# Patient Record
Sex: Male | Born: 1995 | Hispanic: No | Smoking: Never smoker
Health system: Southern US, Community
[De-identification: ages and names within clinical notes are randomized; demographics above are authoritative.]

## PROBLEM LIST (undated history)

## (undated) DIAGNOSIS — E559 Vitamin D deficiency, unspecified: Secondary | ICD-10-CM

## (undated) HISTORY — DX: Vitamin D deficiency, unspecified: E55.9

## (undated) NOTE — Telephone Encounter (Signed)
Formatting of this note might be different from the original.  Clinical Contact Center Onboarding Outreach    Attempt: 1st attempt    Outcome: Unsuccessful - Left voicemail. Please connect them to the New Member number located in the [employer] phonebook. If the member calls back outside the business hours (7a-7p M-F excluding holidays) please give member this number [phone]) and ask them to call back during New Member Activation Desk hours.    Number of family members: 2  Electronically signed by Colbert Coyer at 04/07/2022 11:52 AM EST

## (undated) NOTE — Progress Notes (Signed)
Formatting of this note is different from the original.  Images from the original note were not included.  [facility]  [facility]       CHIEF COMPLAINT   SPOTS ON SKIN      SUBJECTIVE   Blake Monroe is a 41 yr old presenting with a rash for approximately 8 year(s).    Description: erythematous, scaly, pruritic, and spreading  Location: chest and back.  Changes in the rash since first noticing: Yes - started on arms only and chest and back  Irritant/trigger: none known  Throat swelling/difficulty breathing: No  Fevers: No  Symptoms of infection: No    Relevant medical history reviewed/updated.    OBJECTIVE   BP 112/53   Pulse 92   Temp 98.4 F (36.9 C) (Oral)   Resp 18   Wt 188 lb (85.3 kg)   SpO2 100%   GENERAL: Alert, nontoxic   CHEST: Unlabored breathing   SKIN: clustered, diffuse, erythematous    Location: chest and back   I have advised the patient that photograph/video/audio recording(s) will be taken of the patient for care delivery/treatment purposes and the patient consented to having the recording(s) taken.        ASSESSMENT/PLAN   Evaluation supportive of dermatitis.     ICD-10-CM   1. TINEA VERSICOLOR  B36.0   2. DERMATITIS  L30.9   3. DECLINES VACCINATION  Z28.20     Orders Placed This Encounter    Fluconazole (DIFLUCAN) 150 mg Oral Tab    Pyrithione Zinc (DHS ZINC) 2 % sclp Shampoo    Rapid Read TeleDerm       See diagnosis entry for additional diagnoses addressed in this visit.   Diagnoses, treatment plan, and precautions reviewed with patient.     I have confirmed the presence of the listed clinical diagnoses, which were considered in the current and ongoing care of the patient. At the time of this visit, the patient stated, and/or the medical record indicates, that there are no changes in these conditions, unless otherwise noted, and the patient was advised to follow up with specialist as treatment warrants.  Health Maintenance and care gaps reviewed and  addressed.    Telederm sent; response :    Tinea versicolor    RECOMMENDATIONS     TINEA VERSICOLOR    What is it?  - This is a common superficial infection of the skin caused by the yeast Malassezia furfur.    - Although it is part of the normal skin flora, a number of favorable conditions may trigger its conversion to the hyphal form, which results in clinical disease  - Favorable conditions include hot and humid weather, use of oils, excessive sweating, and immunosuppression  - It is more common in tropical climates (30-40% of adults)  - Recurrence of infection after successful treatment are common.  In the first year, the rate of recurrence can be as high as 60%, this rate increases to 80% in the second year    What does it look like?  - Well-demarcated, finely scaling thin plaques with variable pigmentation  - Most often occurs on the upper trunk and extremities  - Most patients don't have any symptoms, but sometimes it may be a little itchy  - Treated or inactive areas no longer have scaliness, but the color changes can persist for months after active infection has been eradicated    What are treatment options?  - Topical antifungal products are usually effective,  but sometimes oral medications are warranted.      MY RECOMMENDATIONS:    - Fluconazole (Diflucan) 150mg  --> Take two tablets once weekly x 4 weeks.  Potential side effects:  Nausea, headache, skin rash, vomiting, abdominal pain, diarrhea, increases in liver enzymes, rare cases of gallstones and hepatic failure.        FOR MAINTENANCE:  - Use Head and Shoulders (zinc pyrithione) shampoo as a body wash daily on weekends as a body wash chronically to prevent recurrence.  - The pigmentation changes may take several months to fade.  - You may repeat the more frequent application cycle if the condition flares in the future.    I have advised the patient that photograph/video/audio recording(s) will be taken of the patient for care delivery/treatment  purposes and the patient consented to having the recording(s) taken.    Patient instructions:  Patient Education           Electronically signed by Georgeanna Lea (D.O.), D.O. at 11/03/2021 12:21 PM EDT

---

## 2020-11-25 NOTE — Progress Notes (Signed)
Subjective:    CC: L knee pain  I, Anthony Maxwell, LAT, ATC, am serving as scribe for Dr. Clementeen Graham.  HPI: Pt is a 25 y/o male presenting w/ c/o L knee pain for past 7-8 years. [place] had [place] be in plaster cast for 1 month. Notes that when he runs or does squats, he will have anterior knee pain. Has tried topical analgesics and knee braces for pain relief.   Patient also complains of rash throughout body. Circular, non-raised pattern. No itching but notes that when working out in field that rash will become raised. Did use cream from dermatologist in home country which helped.   Pertinent review of Systems: No fevers or chills.  No weight loss.  Relevant historical information: Immigrated from [place].   Objective:    Vitals:   11/28/20 1016  BP: 114/80  Pulse: 86  SpO2: 98%   General: Well Developed, well nourished, and in no acute distress.   MSK: Left knee normal-appearing Normal motion without crepitation.  Nontender. Stable ligamentous exam. Intact strength. Negative McMurray's test.  Skin: Multiple circular macular mildly erythematous rashes across trunk and extremities and a somewhat Christmas tree pattern on trunk.  Nontender.  Lab and Radiology Results  Diagnostic Limited MSK Ultrasound of: Left knee Quad tendon intact and normal-appearing No significant joint effusion. Patellar tendon normal. Medial and lateral joint lines normal-appearing without visible tear of the medial or lateral meniscus. Posterior knee no Baker's cyst. Impression: Normal-appearing MSK ultrasound  X-ray images left knee obtained today personally and independently interpreted No acute fractures.  Noted significant degenerative changes. Await formal radiology review     Impression and Recommendations:    Assessment and Plan: 25 y.o. male with left knee pain.  History of what sounds like a ligamentous injury 7 or 8 years ago treated  with immobilization for about a month without good rehab.  Etiology today of his pain is somewhat unclear.  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  Possibly he has some persistent laxity that is not very well appreciated on exam testing today.  Plan for trial of conservative management with physical therapy trial and Voltaren gel and recheck in about 6 to 8 weeks.  If not improved would consider MRI as next step.  Rash: Likely pityriasis rosea based on appearance of rash.  Trial of topical triamcinolone cream.  Ideally this should be addressed by a primary care provider however he does not have a PCP.  Reassess and follow-up in 6 to 8 weeks.Marland Kitchen  PDMP not reviewed this encounter. Orders Placed This Encounter  Procedures   [place] LIMITED JOINT SPACE STRUCTURES LOW LEFT(NO LINKED CHARGES)    Order Specific Question:   Reason for Exam (SYMPTOM  OR DIAGNOSIS REQUIRED)    Answer:   L knee pain    Order Specific Question:   Preferred imaging location?    Answer:   [facility] Knee AP/LAT W/Sunrise Left    Standing Status:   Future    Number of Occurrences:   1    Standing Expiration Date:   11/28/2021    Order Specific Question:   Reason for Exam (SYMPTOM  OR DIAGNOSIS REQUIRED)    Answer:   eval knee pain    Order Specific Question:   Preferred imaging location?    Answer:   [facility]   Ambulatory referral to Physical Therapy    Referral Priority:  Routine    Referral Type:   Physical Medicine    Referral Reason:   Specialty Services Required    Requested Specialty:   Physical Therapy   Meds ordered this encounter  Medications   triamcinolone cream (KENALOG) 0.1 %    Sig: Apply 1 application topically 2 (two) times daily.    Dispense:  453.6 g    Refill:  5    Discussed warning signs or symptoms. Please see discharge instructions. Patient expresses understanding.   The above documentation has been reviewed and is accurate and complete Clementeen Graham,  M.D.

## 2020-11-28 ENCOUNTER — Encounter: Payer: Self-pay | Admitting: Family Medicine

## 2020-11-28 ENCOUNTER — Other Ambulatory Visit: Payer: Self-pay

## 2020-11-28 ENCOUNTER — Ambulatory Visit: Payer: Self-pay

## 2020-11-28 ENCOUNTER — Ambulatory Visit (INDEPENDENT_AMBULATORY_CARE_PROVIDER_SITE_OTHER): Payer: Medicaid Other

## 2020-11-28 ENCOUNTER — Ambulatory Visit (INDEPENDENT_AMBULATORY_CARE_PROVIDER_SITE_OTHER): Payer: Medicaid Other | Admitting: Family Medicine

## 2020-11-28 VITALS — BP 114/80 | HR 86 | Ht 70.87 in | Wt 189.0 lb

## 2020-11-28 DIAGNOSIS — M25562 Pain in left knee: Secondary | ICD-10-CM | POA: Diagnosis not present

## 2020-11-28 DIAGNOSIS — L42 Pityriasis rosea: Secondary | ICD-10-CM

## 2020-11-28 MED ORDER — TRIAMCINOLONE ACETONIDE 0.1 % EX CREA: 1.0000 "application " | TOPICAL_CREAM | Freq: Two times a day (BID) | CUTANEOUS | 5 refills | Status: AC

## 2020-11-28 NOTE — Patient Instructions (Addendum)
Thank you for coming in today.   Apply the cream 2x daily until the rash improves.   I think the rash is Pityriasis Rosea  You may need to do it again in the future.   Please get an Xray today before you leave   I've referred you to Physical Therapy.  Let us know if you don't hear from them in one week.   Please use Voltaren gel (Generic Diclofenac Gel) up to 4x daily for pain as needed.  This is available over-the-counter as both the name brand Voltaren gel and the generic diclofenac gel.   Recheck with me in 8 weeks.   If not improved I will plan for MRI of the knee.

## 2020-11-29 NOTE — Progress Notes (Signed)
Left knee x-ray looks normal to radiology

## 2020-12-14 ENCOUNTER — Ambulatory Visit: Payer: Medicaid Other | Attending: Family Medicine | Admitting: Physical Therapy

## 2020-12-14 DIAGNOSIS — G8929 Other chronic pain: Secondary | ICD-10-CM | POA: Insufficient documentation

## 2020-12-14 DIAGNOSIS — M25562 Pain in left knee: Secondary | ICD-10-CM | POA: Insufficient documentation

## 2020-12-14 DIAGNOSIS — M6281 Muscle weakness (generalized): Secondary | ICD-10-CM | POA: Insufficient documentation

## 2020-12-21 ENCOUNTER — Encounter: Payer: Self-pay | Admitting: Physical Therapy

## 2020-12-21 ENCOUNTER — Ambulatory Visit: Payer: Medicaid Other | Admitting: Physical Therapy

## 2020-12-21 ENCOUNTER — Other Ambulatory Visit: Payer: Self-pay

## 2020-12-21 DIAGNOSIS — G8929 Other chronic pain: Secondary | ICD-10-CM | POA: Diagnosis present

## 2020-12-21 DIAGNOSIS — M25562 Pain in left knee: Secondary | ICD-10-CM | POA: Diagnosis present

## 2020-12-21 DIAGNOSIS — M6281 Muscle weakness (generalized): Secondary | ICD-10-CM | POA: Diagnosis present

## 2020-12-21 NOTE — Therapy (Addendum)
[facility] [address] [place], [place], [address] Phone: [phone]   Fax:  [phone]  Physical Therapy Evaluation  Patient Details  Name: Anthony Maxwell MRN: [id number] Date of Birth: [birth year] Referring Provider (PT): Rodolph Bong, MD  Encounter Date: 12/21/2020   PT End of Session - 12/22/20 1345     Visit Number 1    Date for PT Re-Evaluation 02/16/21    Authorization Type [place] MCD - pending    PT Start Time 1745    PT Stop Time 1830    PT Time Calculation (min) 45 min    Activity Tolerance Patient tolerated treatment well    Behavior During Therapy [facility] for tasks assessed/performed             History reviewed. No pertinent past medical history.  History reviewed. No pertinent surgical history.  There were no vitals filed for this visit.    Subjective Assessment - 12/22/20 1343     Subjective Anthony Maxwell is a 25 y.o. male who presents to clinic with chief complaint of L knee pain.  MOI/History of condition: L knee pain for ~8 years.  He was sitting with his legs crossed when he tried to reach for something and had sudden onset of L knee pain.  He had very little knee motion immediately following.  His knee was immobilized for 1 month.  After this his leg felt somewhat better.  When playing football since he has sharp pain under this knee cap.  Pain location: under patella.  Red flags: none.  48 hour pain intensity:  highest 5/10, current 0/10, best 0/10.  Aggs: squats (5-10 squats delayed onset), bike (10-15 min).  Eases: icy hot type cream, rest.  Nature: sharp.  Severity: mod.  Irritability: mod.  Stage: chronic.  Stability: staying the same.  24 hour pattern: worse with activity.  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  Hobbies: soccer, bike, gym.  Functional limitations/goals: recreation, deep squatting, steps.  Home environment: lives with wife.  Assistive device: none.   Hand  dominance: R.  Falls: none.  Referring provider: Rodolph Bong, MD    Pertinent History Significant PMH: denies                 [facility] PT Assessment - 12/22/20 0001       Assessment   Medical Diagnosis M25.562 (ICD-10-CM) - Left knee pain, unspecified chronicity    Referring Provider (PT) Rodolph Bong, MD    Onset Date/Surgical Date 12/22/12    Hand Dominance Right    Next MD Visit 01/23/21    Prior Therapy none      Precautions   Precaution Comments non      Restrictions   Other Position/Activity Restrictions non      Balance Screen   Has the patient fallen in the past 6 months No    Has the patient had a decrease in activity level because of a fear of falling?  No    Is the patient reluctant to leave their home because of a fear of falling?  No      Functional Tests   Functional tests Single leg stance;Squat;Other      Squat   Comments R w/s during 10x squat      Single Leg Stance   Comments able to maintain >30'' ea, minimal hip drop, more unstable on L      Other:   Other/ Comments knee ext machine 1  RM: L: 65#, R: 80#      ROM / Strength   AROM / PROM / Strength --   knee ROM WNL     Flexibility   Soft Tissue Assessment /Muscle Length yes    Hamstrings L HS limited compared to R    Quadriceps WNL      Palpation   Palpation comment no TTP, pt reports sharp pain under kneecap with activity      Special Tests   Other special tests L knee ligamentous testing (-)                        Objective measurements completed on examination: See above findings.               PT Education - 12/22/20 1343     Education Details POC, diagnosis, prognosis, HEP.  Pt educated via explanation, demonstration, and handout (HEP).  Pt confirms understanding verbally.                 PT Long Term Goals - 12/22/20 1346       PT LONG TERM GOAL #1   Title Anthony Maxwell will be >75% HEP compliant throughout therapy to improve  carryover between sessions and facilitate independent management of condition.    Baseline no HEP    Status New    Target Date 02/16/21      PT LONG TERM GOAL #2   Title Anthony Maxwell will be able to perform a squat to 100 degrees knee flexion with no R w/s, not limited by pain to enable return to gym  EVAL: R w/s    Baseline R weight shift    Target Date 02/16/21      PT LONG TERM GOAL #3   Title Anthony Maxwell will be demonstrate quad strength in knee ext to within 10% of R leg  EVAL: L knee ext 65#, R 80#    Baseline L knee ext 65#, R 80#    Status New    Target Date 02/16/21      PT LONG TERM GOAL #4   Title Anthony Maxwell will report >/= 50% decrease in pain from evaluation  EVAL: 4/10 max pain    Baseline 4/10 max pain    Status New    Target Date 02/16/21      PT LONG TERM GOAL #5   Title Anthony Capuchin Leon will be able to bike for 45 min, not limited by pain  EVAL: limited to 10-15 min    Baseline EVAL: limited to 10-15 min    Status New    Target Date 02/16/21                    Plan - 12/22/20 1346     Clinical Impression Statement Anthony Maxwell is a 25 y.o. 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  Pt presents with pain and impairments/deficits in: L quad weakness compared to R, squat form, L HS length.  Activity limitations include: deep squatting, running, biking.  Participation limitations include: playing football, working out, recreation.  Pt will benefit from skilled therapy to address pain and the listed deficits in order to achieve functional goals, enable safety and independence in completion of daily tasks, and return to PLOF.    Stability/Clinical Decision Making Stable/Uncomplicated    Clinical Decision Making Low    Rehab Potential Good  PT Frequency 2x / week    PT Duration 8 weeks    PT Treatment/Interventions ADLs/Self Care Home Management;Aquatic Therapy;Gait training;Therapeutic  activities;Therapeutic exercise;Neuromuscular re-education;Manual techniques;Dry needling;Joint Manipulations;Iontophoresis 4mg /ml Dexamethasone    PT Next Visit Plan Update HEP for HS stretch, progressive quad strengthening, work on balance as able    PT Home Exercise Plan [email or web address]    Recommended Other Services none    Consulted and Agree with Plan of Care Patient             Patient will benefit from skilled therapeutic intervention in order to improve the following deficits and impairments:  Pain, Decreased strength, Impaired flexibility  Visit Diagnosis: Chronic pain of left knee - Plan: PT plan of care cert/re-cert  Muscle weakness - Plan: PT plan of care cert/re-cert     Problem List There are no problems to display for this patient.   PT, DPT 12/22/20 2:43 PM  [facility] [address] [place], [place], [place] Phone: [phone]   Fax:  [phone]  Name: Hershel Corkery MRN: Anthony Maxwell Date of Birth: 29-May-1995

## 2020-12-22 NOTE — Patient Instructions (Signed)
Access Code: [email or web address] URL: [email or web address] Date: 12/22/2020 Prepared by: Alphonzo Severance  Exercises Single Leg Knee Extension with Weight Machine - 3 x weekly - 3 sets - 5 reps - 7 hold Seated Hamstring Stretch - 2 x daily - 7 x weekly - 1 sets - 3 reps - 45 hold

## 2020-12-28 ENCOUNTER — Telehealth: Payer: Self-pay | Admitting: Physical Therapy

## 2020-12-28 ENCOUNTER — Ambulatory Visit: Payer: Medicaid Other | Admitting: Physical Therapy

## 2020-12-28 NOTE — Telephone Encounter (Signed)
Called and informed patient of missed visit and provided reminder of next appt and attendance policy.  

## 2021-01-05 ENCOUNTER — Other Ambulatory Visit: Payer: Self-pay

## 2021-01-05 ENCOUNTER — Ambulatory Visit: Payer: Medicaid Other

## 2021-01-05 DIAGNOSIS — M25562 Pain in left knee: Secondary | ICD-10-CM

## 2021-01-05 DIAGNOSIS — G8929 Other chronic pain: Secondary | ICD-10-CM

## 2021-01-05 DIAGNOSIS — M6281 Muscle weakness (generalized): Secondary | ICD-10-CM

## 2021-01-05 NOTE — Therapy (Addendum)
[facility] [address] [place], [place], [address] Phone: [phone]   Fax:  [phone]  Physical Therapy Treatment/Discharge  Patient Details  Name: Elton Heid MRN: [id number] Date of Birth: November 05, 1995 Referring Provider (PT): Rodolph Bong, MD   Encounter Date: 01/05/2021   PT End of Session - 01/06/21 0839     Visit Number 2    Date for PT Re-Evaluation 02/16/21    Authorization Type [place] MCD - CCME    Authorization Time Period 12/28/20 - 01/17/21    Authorization - Visit Number 1    Authorization - Number of Visits 3    PT Start Time 1800   arrived late   PT Stop Time 1828    PT Time Calculation (min) 28 min    Activity Tolerance Patient tolerated treatment well    Behavior During Therapy [facility] for tasks assessed/performed             No past medical history on file.  No past surgical history on file.  There were no vitals filed for this visit.   Subjective Assessment - 01/05/21 1800     Subjective Pt presents to PT with reports of continued medial L knee pain during exercise, up to 5/10. He has been compliant with his HEP with no adverse effect. Pt is ready to begin PT treatment at this time.    Currently in Pain? No/denies           [facility] Adult PT Treatment/Exercise:   Therapeutic Exercise:  Supine SLR 2x15 ea S/L clamshell 2x15 GTB Supine hamstring stretch w/ strap 2x30 sec Lateral walk x 3 laps in // RTB Mini squat in // 2x10 Single leg knee ext 3x10 L 20lbs     [facility] PT Assessment - 01/06/21 0001       AROM   Overall AROM Comments 50 degrees L ankle DF in half squat                                        PT Long Term Goals - 12/22/20 1346       PT LONG TERM GOAL #1   Title Mirian Capuchin Freund will be >75% HEP compliant throughout therapy to improve carryover between sessions and facilitate independent management of condition.    Baseline no HEP     Status New    Target Date 02/16/21      PT LONG TERM GOAL #2   Title Mirian Capuchin Graefe will be able to perform a squat to 100 degrees knee flexion with no R w/s, not limited by pain to enable return to gym  EVAL: R w/s    Baseline R weight shift    Target Date 02/16/21      PT LONG TERM GOAL #3   Title Thana Ates will be demonstrate quad strength in knee ext to within 10% of R leg  EVAL: L knee ext 65#, R 80#    Baseline L knee ext 65#, R 80#    Status New    Target Date 02/16/21      PT LONG TERM GOAL #4   Title Thana Ates will report >/= 50% decrease in pain from evaluation  EVAL: 4/10 max pain    Baseline 4/10 max pain    Status New    Target Date 02/16/21      PT LONG  TERM GOAL #5   Title Mirian Capuchin Avina will be able to bike for 45 min, not limited by pain  EVAL: limited to 10-15 min    Baseline EVAL: limited to 10-15 min    Status New    Target Date 02/16/21                   Plan - 01/05/21 1810     Clinical Impression Statement Pt was able to complete prescribed eexercises with no adverse effect. Today's session focused on continued strengthening of quad and proximal hip. Pt continues to benefit from PT and should continue to be seen per POC.    PT Treatment/Interventions ADLs/Self Care Home Management;Aquatic Therapy;Gait training;Therapeutic activities;Therapeutic exercise;Neuromuscular re-education;Manual techniques;Dry needling;Joint Manipulations;Iontophoresis 4mg /ml Dexamethasone    PT Next Visit Plan Update HEP for HS stretch, progressive quad strengthening, work on balance as able    PT Home Exercise Plan [email or web address]             Patient will benefit from skilled therapeutic intervention in order to improve the following deficits and impairments:  Pain, Decreased strength, Impaired flexibility  Visit Diagnosis: Chronic pain of left knee  Muscle weakness     Problem List There are no problems to display for this  patient.   , PT, DPT 01/06/21 8:41 AM  [facility] [address] [place], [place], [place] Phone: [phone]   Fax:  [phone]  Name: Eutimio Gharibian MRN: Thana Ates Date of Birth: 27-Jun-1995  PHYSICAL THERAPY DISCHARGE SUMMARY  Visits from Start of Care: 2  Current functional level related to goals / functional outcomes: Unable to assess   Remaining deficits: Unable to assess   Education / Equipment: Unable to assess   Patient agrees to discharge. Patient goals were  did not assess . Patient is being discharged due to not returning since the last visit.

## 2021-01-10 ENCOUNTER — Encounter: Payer: Self-pay | Admitting: Nurse Practitioner

## 2021-01-10 ENCOUNTER — Other Ambulatory Visit: Payer: Self-pay

## 2021-01-10 ENCOUNTER — Ambulatory Visit: Payer: Medicaid Other | Admitting: Nurse Practitioner

## 2021-01-10 VITALS — BP 105/63 | HR 84 | Temp 97.2°F | Ht 70.87 in | Wt 192.0 lb

## 2021-01-10 DIAGNOSIS — R21 Rash and other nonspecific skin eruption: Secondary | ICD-10-CM | POA: Diagnosis not present

## 2021-01-10 DIAGNOSIS — Z0000 Encounter for general adult medical examination without abnormal findings: Secondary | ICD-10-CM | POA: Diagnosis not present

## 2021-01-10 LAB — POCT URINALYSIS DIP (CLINITEK)
Bilirubin, UA: NEGATIVE
Glucose, UA: NEGATIVE mg/dL
Ketones, POC UA: NEGATIVE mg/dL
Leukocytes, UA: NEGATIVE
Nitrite, UA: NEGATIVE
POC PROTEIN,UA: NEGATIVE
Spec Grav, UA: >=1.03 — AB (ref 1.010–1.025)
Urobilinogen, UA: 0.2 E.U./dL
pH, UA: 5.5 (ref 5.0–8.0)

## 2021-01-10 LAB — POCT GLYCOSYLATED HEMOGLOBIN (HGB A1C): Hemoglobin A1C: 4.9 % (ref 4.0–5.6)

## 2021-01-10 MED ORDER — DIPHENHYDRAMINE HCL 25 MG PO TABS: 25.0000 mg | ORAL_TABLET | Freq: Four times a day (QID) | ORAL | 0 refills | Status: AC | PRN

## 2021-01-10 MED ORDER — FAMOTIDINE 40 MG PO TABS: 40.0000 mg | ORAL_TABLET | Freq: Every day | ORAL | 0 refills | Status: DC

## 2021-01-10 MED ORDER — PREDNISONE 20 MG PO TABS: 20.0000 mg | ORAL_TABLET | Freq: Two times a day (BID) | ORAL | 0 refills | Status: AC

## 2021-01-10 NOTE — Progress Notes (Addendum)
[place] [place], [place]  [address] Phone:  [phone]   Fax:  [phone] Subjective:   Patient ID: Anthony Maxwell, male    DOB: [birth year], 25 y.o.   MRN: [id number]  Chief Complaint  Patient presents with   Establish Care    Red spots all over body, rash. Was given triamcinolone cream, no relief.    HPI Anthony Maxwell 25 y.o. male presents with   Rash: Patient complains of rash involving the generalized. Rash started 1 year ago. Appearance of rash at onset: Color of lesion(s): red, Size of lesion(s): maculopapular. Rash has changed over time Initial distribution:  was initially in only the arms, currently generalized .  Discomfort associated with rash: is pruritic.  Associated symptoms: none. Denies: arthralgia, fever, and myalgia. Patient has had previous evaluation of rash. Patient has had previous treatment.  Response to treatment: no improvement. Patient has not had contacts with similar rash. Patient has not identified precipitant. Patient has not had new exposures (soaps, lotions, laundry detergents, foods, medications, plants, insects or animals.) Had condition prior,  3-4 yrs ago, given lotion, which resolved rash . States he has moved several times in the past year, refugee. Currently works as Investment banker, operational. Itching began 1 mth ago.  Denies any alleviating or aggravating factors.  History reviewed. No pertinent past medical history.  History reviewed. No pertinent surgical history.  History reviewed. No pertinent family history.  Social History   Socioeconomic History   Marital status: Single    Spouse name: Not on file   Number of children: Not on file   Years of education: Not on file   Highest education level: Not on file  Occupational History   Not on file  Tobacco Use   Smoking status: Never   Smokeless tobacco: Never  Substance and Sexual Activity   Alcohol use: Never   Drug use: Not Currently    Sexual activity: Yes  Other Topics Concern   Not on file  Social History Narrative   Not on file   Social Determinants of Health   Financial Resource Strain: Not on file  Food Insecurity: Not on file  Transportation Needs: Not on file  Physical Activity: Not on file  Stress: Not on file  Social Connections: Not on file  Intimate Partner Violence: Not on file    Outpatient Medications Prior to Visit  Medication Sig Dispense Refill   triamcinolone cream (KENALOG) 0.1 % Apply 1 application topically 2 (two) times daily. (Patient not taking: Reported on 01/10/2021) 453.6 g 5   No facility-administered medications prior to visit.    No Known Allergies  Review of Systems  Constitutional:  Negative for chills, fever and malaise/fatigue.  Eyes: Negative.   Respiratory:  Negative for cough and shortness of breath.   Cardiovascular:  Negative for chest pain, palpitations and leg swelling.  Gastrointestinal:  Negative for abdominal pain, blood in stool, constipation, diarrhea, nausea and vomiting.  Musculoskeletal: Negative.   Skin:  Positive for rash.  Psychiatric/Behavioral:  Negative for depression. The patient is not nervous/anxious.   All other systems reviewed and are negative.     Objective:    Physical Exam Vitals reviewed.  Constitutional:      General: He is not in acute distress.    Appearance: Normal appearance.  HENT:     Head: Normocephalic.  Eyes:     Extraocular Movements: Extraocular movements intact.     Conjunctiva/sclera: Conjunctivae normal.  Pupils: Pupils are equal, round, and reactive to light.  Cardiovascular:     Rate and Rhythm: Normal rate and regular rhythm.     Pulses: Normal pulses.     Heart sounds: Normal heart sounds.     Comments: No obvious peripheral edema Pulmonary:     Effort: Pulmonary effort is normal.     Breath sounds: Normal breath sounds.  Musculoskeletal:        General: Normal range of motion.  Lymphadenopathy:      Cervical: No cervical adenopathy.     Upper Body:     Right upper body: No supraclavicular adenopathy.     Left upper body: No supraclavicular adenopathy.  Skin:    General: Skin is warm and dry.     Capillary Refill: Capillary refill takes less than 2 seconds.     Comments: Generalized maculopapular rash with mild erythema. No excoriations. No drainage from sites.  Neurological:     General: No focal deficit present.     Mental Status: He is alert and oriented to person, place, and time.  Psychiatric:        Mood and Affect: Mood normal.        Behavior: Behavior normal.        Thought Content: Thought content normal.        Judgment: Judgment normal.    BP 105/63 (BP Location: Right Arm, Patient Position: Sitting)   Pulse 84   Temp (!) 97.2 F (36.2 C)   Ht 5' 10.87" (1.8 m)   Wt 192 lb 0.2 oz (87.1 kg)   SpO2 98%   BMI 26.88 kg/m  Wt Readings from Last 3 Encounters:  01/10/21 192 lb 0.2 oz (87.1 kg)  11/28/20 189 lb (85.7 kg)    Immunization History  Administered Date(s) Administered   Janssen (J&J) SARS-COV-2 Vaccination 06/09/2020   Unspecified SARS-COV-2 Vaccination 04/27/2020    Diabetic Foot Exam - Simple   No data filed     No results found for: TSH Lab Results  Component Value Date   WBC 7.9 01/10/2021   HGB 14.4 01/10/2021   HCT 42.6 01/10/2021   MCV 87 01/10/2021   PLT 276 01/10/2021   Lab Results  Component Value Date   NA 139 01/10/2021   K 4.2 01/10/2021   CO2 24 01/10/2021   GLUCOSE 97 01/10/2021   BUN 15 01/10/2021   CREATININE 0.86 01/10/2021   BILITOT 0.5 01/10/2021   ALKPHOS 57 01/10/2021   AST 25 01/10/2021   ALT 18 01/10/2021   PROT 7.3 01/10/2021   ALBUMIN 4.9 01/10/2021   CALCIUM 9.6 01/10/2021   EGFR 124 01/10/2021   Lab Results  Component Value Date   CHOL 145 01/10/2021   Lab Results  Component Value Date   HDL 34 (L) 01/10/2021   Lab Results  Component Value Date   LDLCALC 75 01/10/2021   Lab Results   Component Value Date   TRIG 218 (H) 01/10/2021   Lab Results  Component Value Date   CHOLHDL 4.3 01/10/2021   Lab Results  Component Value Date   HGBA1C 4.9 01/10/2021       Assessment & Plan:   Problem List Items Addressed This Visit   None Visit Diagnoses     Healthcare maintenance    -  Primary   Relevant Orders   CBC with Differential   Comprehensive metabolic panel   Lipid Panel   POCT URINALYSIS DIP (CLINITEK) (Completed)   HgB A1c (Completed)  Rash       Relevant Medications   predniSONE (DELTASONE) 20 MG tablet   diphenhydrAMINE (BENADRYL ALLERGY) 25 MG tablet   famotidine (PEPCID) 40 MG tablet   Patient to follow up in 6 mths for annual wellness exam or sooner as needed    I am having Parks Ranger start on predniSONE, diphenhydrAMINE, and famotidine. I am also having him maintain his triamcinolone cream.  Meds ordered this encounter  Medications   predniSONE (DELTASONE) 20 MG tablet    Sig: Take 1 tablet (20 mg total) by mouth in the morning and at bedtime for 5 days.    Dispense:  10 tablet    Refill:  0   diphenhydrAMINE (BENADRYL ALLERGY) 25 MG tablet    Sig: Take 1 tablet (25 mg total) by mouth every 6 (six) hours as needed for itching.    Dispense:  30 tablet    Refill:  0   famotidine (PEPCID) 40 MG tablet    Sig: Take 1 tablet (40 mg total) by mouth at bedtime for 5 days.    Dispense:  5 tablet    Refill:  0     Teena Dunk, NP

## 2021-01-10 NOTE — Patient Instructions (Signed)
You were seen today in the [facility] for your rash and establish care . Labs were collected, results will be available via MyChart or, if abnormal, you will be contacted by clinic staff. You were prescribed medications, please take as directed. Please follow up in 6 mths for annual exam, or sooner as needed.

## 2021-01-11 ENCOUNTER — Ambulatory Visit: Payer: Medicaid Other | Admitting: Physical Therapy

## 2021-01-11 LAB — COMPREHENSIVE METABOLIC PANEL
ALT: 18 IU/L (ref 0–44)
AST: 25 IU/L (ref 0–40)
Albumin/Globulin Ratio: 2 (ref 1.2–2.2)
Albumin: 4.9 g/dL (ref 4.1–5.2)
Alkaline Phosphatase: 57 IU/L (ref 44–121)
BUN/Creatinine Ratio: 17 (ref 9–20)
BUN: 15 mg/dL (ref 6–20)
Bilirubin Total: 0.5 mg/dL (ref 0.0–1.2)
CO2: 24 mmol/L (ref 20–29)
Calcium: 9.6 mg/dL (ref 8.7–10.2)
Chloride: 100 mmol/L (ref 96–106)
Creatinine, Ser: 0.86 mg/dL (ref 0.76–1.27)
Globulin, Total: 2.4 g/dL (ref 1.5–4.5)
Glucose: 97 mg/dL (ref 65–99)
Potassium: 4.2 mmol/L (ref 3.5–5.2)
Sodium: 139 mmol/L (ref 134–144)
Total Protein: 7.3 g/dL (ref 6.0–8.5)
eGFR: 124 mL/min/{1.73_m2} (ref >59)

## 2021-01-11 LAB — CBC WITH DIFFERENTIAL/PLATELET
Basophils Absolute: 0.1 10*3/uL (ref 0.0–0.2)
Basos: 1 %
EOS (ABSOLUTE): 0.3 10*3/uL (ref 0.0–0.4)
Eos: 3 %
Hematocrit: 42.6 % (ref 37.5–51.0)
Hemoglobin: 14.4 g/dL (ref 13.0–17.7)
Immature Grans (Abs): 0 10*3/uL (ref 0.0–0.1)
Immature Granulocytes: 1 %
Lymphocytes Absolute: 1.9 10*3/uL (ref 0.7–3.1)
Lymphs: 24 %
MCH: 29.4 pg (ref 26.6–33.0)
MCHC: 33.8 g/dL (ref 31.5–35.7)
MCV: 87 fL (ref 79–97)
Monocytes Absolute: 0.6 10*3/uL (ref 0.1–0.9)
Monocytes: 7 %
Neutrophils Absolute: 5 10*3/uL (ref 1.4–7.0)
Neutrophils: 64 %
Platelets: 276 10*3/uL (ref 150–450)
RBC: 4.89 x10E6/uL (ref 4.14–5.80)
RDW: 11.8 % (ref 11.6–15.4)
WBC: 7.9 10*3/uL (ref 3.4–10.8)

## 2021-01-11 LAB — LIPID PANEL
Chol/HDL Ratio: 4.3 ratio (ref 0.0–5.0)
Cholesterol, Total: 145 mg/dL (ref 100–199)
HDL: 34 mg/dL — ABNORMAL LOW (ref >39)
LDL Chol Calc (NIH): 75 mg/dL (ref 0–99)
Triglycerides: 218 mg/dL — ABNORMAL HIGH (ref 0–149)
VLDL Cholesterol Cal: 36 mg/dL (ref 5–40)

## 2021-01-18 ENCOUNTER — Telehealth: Payer: Self-pay

## 2021-01-18 ENCOUNTER — Other Ambulatory Visit: Payer: Self-pay | Admitting: Nurse Practitioner

## 2021-01-18 DIAGNOSIS — R21 Rash and other nonspecific skin eruption: Secondary | ICD-10-CM

## 2021-01-18 NOTE — Telephone Encounter (Signed)
Patient notified of referral

## 2021-01-18 NOTE — Telephone Encounter (Signed)
Patient left voicemail stating the medication that you sent in for his rash is not working. Is there something else you can recommend. Thanks

## 2021-01-20 NOTE — Progress Notes (Deleted)
   I, Christoper Fabian, LAT, ATC, am serving as scribe for Dr. Clementeen Graham.  Anthony Maxwell is a 25 y.o. male who presents to [facility] at [facility] today for f/u of chronic L knee pain.  He was last seen by Dr. Denyse Amass on 11/28/20 and was referred to PT of which he's completed 2 sessions.  He was also advised to use Voltaren gel on his knee.  Pt also c/o of a rash for which triamcinolone cream was prescribed.  Today, pt reports   Diagnostic testing:  L knee XR- 11/28/20  Pertinent review of systems: ***  Relevant historical information: ***   Exam:  There were no vitals taken for this visit. General: Well Developed, well nourished, and in no acute distress.   MSK: ***    Lab and Radiology Results No results found for this or any previous visit (from the past 72 hour(s)). No results found.     Assessment and Plan: 25 y.o. male with ***   PDMP not reviewed this encounter. No orders of the defined types were placed in this encounter.  No orders of the defined types were placed in this encounter.    Discussed warning signs or symptoms. Please see discharge instructions. Patient expresses understanding.   ***

## 2021-01-23 ENCOUNTER — Ambulatory Visit: Payer: Medicaid Other | Admitting: Family Medicine

## 2021-01-25 NOTE — Congregational Nurse Program (Signed)
  Dept: [phone]   Congregational Nurse Program Note  Date of Encounter: 01/25/2021  Past Medical History: No past medical history on file.  Encounter Details: patient wife came to see me stating that the patient is still having body rash and would like to see a dermatologist. Per patient chart, patient referral is in process and advised her to wait for communication from PCP office.  She also stated that the patient is still taking benadryl as needed but he is no longer taking Kenalog cream because it got finish. Spouse advised patient has upto 5 refills to go to pharmacy for refill.

## 2021-01-31 ENCOUNTER — Other Ambulatory Visit: Payer: Self-pay | Admitting: Nurse Practitioner

## 2021-01-31 ENCOUNTER — Telehealth: Payer: Self-pay

## 2021-01-31 DIAGNOSIS — R21 Rash and other nonspecific skin eruption: Secondary | ICD-10-CM

## 2021-01-31 NOTE — Telephone Encounter (Signed)
Rash medication didn't work Also waiting on derm referral

## 2021-03-09 ENCOUNTER — Emergency Department (HOSPITAL_COMMUNITY)
Admission: EM | Admit: 2021-03-09 | Discharge: 2021-03-09 | Disposition: A | Discharge status: Home / Self Care | Payer: Medicaid Other | Attending: Emergency Medicine | Admitting: Emergency Medicine

## 2021-03-09 ENCOUNTER — Encounter (HOSPITAL_COMMUNITY): Payer: Self-pay

## 2021-03-09 DIAGNOSIS — R197 Diarrhea, unspecified: Secondary | ICD-10-CM | POA: Diagnosis present

## 2021-03-09 DIAGNOSIS — R110 Nausea: Secondary | ICD-10-CM | POA: Insufficient documentation

## 2021-03-09 DIAGNOSIS — Z20822 Contact with and (suspected) exposure to covid-19: Secondary | ICD-10-CM | POA: Insufficient documentation

## 2021-03-09 LAB — CBC
HCT: 45.7 % (ref 39.0–52.0)
Hemoglobin: 15.7 g/dL (ref 13.0–17.0)
MCH: 30.1 pg (ref 26.0–34.0)
MCHC: 34.4 g/dL (ref 30.0–36.0)
MCV: 87.5 fL (ref 80.0–100.0)
Platelets: 222 10*3/uL (ref 150–400)
RBC: 5.22 MIL/uL (ref 4.22–5.81)
RDW: 11.9 % (ref 11.5–15.5)
WBC: 8.2 10*3/uL (ref 4.0–10.5)
nRBC: 0 % (ref 0.0–0.2)

## 2021-03-09 LAB — COMPREHENSIVE METABOLIC PANEL
ALT: 21 U/L (ref 0–44)
AST: 29 U/L (ref 15–41)
Albumin: 4.5 g/dL (ref 3.5–5.0)
Alkaline Phosphatase: 50 U/L (ref 38–126)
Anion gap: 7 (ref 5–15)
BUN: 10 mg/dL (ref 6–20)
CO2: 20 mmol/L — ABNORMAL LOW (ref 22–32)
Calcium: 8.9 mg/dL (ref 8.9–10.3)
Chloride: 106 mmol/L (ref 98–111)
Creatinine, Ser: 0.86 mg/dL (ref 0.61–1.24)
GFR, Estimated: >60 mL/min (ref >60)
Glucose, Bld: 101 mg/dL — ABNORMAL HIGH (ref 70–99)
Potassium: 3.4 mmol/L — ABNORMAL LOW (ref 3.5–5.1)
Sodium: 133 mmol/L — ABNORMAL LOW (ref 135–145)
Total Bilirubin: 1 mg/dL (ref 0.3–1.2)
Total Protein: 7.7 g/dL (ref 6.5–8.1)

## 2021-03-09 LAB — URINALYSIS, ROUTINE W REFLEX MICROSCOPIC
Bilirubin Urine: NEGATIVE
Glucose, UA: NEGATIVE mg/dL
Ketones, ur: 20 mg/dL — AB
Leukocytes,Ua: NEGATIVE
Nitrite: NEGATIVE
Protein, ur: 30 mg/dL — AB
Specific Gravity, Urine: 1.023 (ref 1.005–1.030)
pH: 5 (ref 5.0–8.0)

## 2021-03-09 LAB — RESP PANEL BY RT-PCR (FLU A&B, COVID) ARPGX2
Influenza A by PCR: NEGATIVE
Influenza B by PCR: NEGATIVE
SARS Coronavirus 2 by RT PCR: NEGATIVE

## 2021-03-09 LAB — LIPASE, BLOOD: Lipase: 31 U/L (ref 11–51)

## 2021-03-09 MED ORDER — DIPHENOXYLATE-ATROPINE 2.5-0.025 MG PO TABS: 1.0000 | ORAL_TABLET | Freq: Four times a day (QID) | ORAL | 0 refills | Status: AC | PRN

## 2021-03-09 NOTE — ED Notes (Signed)
An After Visit Summary was printed and given to the patient. Discharge instructions given and no further questions at this time.  

## 2021-03-09 NOTE — ED Provider Notes (Signed)
[facility]-EMERGENCY DEPT Provider Note   CSN: [id number] Arrival date & time: 03/09/21  0827     History Chief Complaint  Patient presents with   Diarrhea    Anthony Maxwell is a 25 y.o. male.  HPI He complains of diarrhea for 2 days, frequent, loose, brown color.  No blood associated.  He has nausea without vomiting.  He feels that he has lost too much fluid.  He denies vertigo, paresthesia, weakness, chest pain, shortness of breath, persistent abdominal pain.  He sometimes has cramping type of pain in his abdomen that comes and goes.  No known sick contacts.  There are no other known modifying factors.    History reviewed. No pertinent past medical history.  There are no problems to display for this patient.   History reviewed. No pertinent surgical history.     History reviewed. No pertinent family history.  Social History   Tobacco Use   Smoking status: Never   Smokeless tobacco: Never  Substance Use Topics   Alcohol use: Never   Drug use: Not Currently    Home Medications Prior to Admission medications   Medication Sig Start Date End Date Taking? Authorizing Provider  diphenoxylate-atropine (LOMOTIL) 2.5-0.025 MG tablet Take 1 tablet by mouth 4 (four) times daily as needed for diarrhea or loose stools. 03/09/21  Yes Mancel Bale, MD  diphenhydrAMINE (BENADRYL ALLERGY) 25 MG tablet Take 1 tablet (25 mg total) by mouth every 6 (six) hours as needed for itching. 01/10/21   Passmore, Enid Derry I, NP  famotidine (PEPCID) 40 MG tablet Take 1 tablet (40 mg total) by mouth at bedtime for 5 days. 01/10/21 01/15/21  Orion Crook I, NP  triamcinolone cream (KENALOG) 0.1 % Apply 1 application topically 2 (two) times daily. Patient not taking: Reported on 01/10/2021 11/28/20   Rodolph Bong, MD    Allergies    Patient has no known allergies.  Review of Systems   Review of Systems  All other systems reviewed and are negative.  Physical  Exam Updated Vital Signs BP 109/70   Pulse 69   Temp 97.9 F (36.6 C) (Oral)   Resp 18   Ht 5\' 10"  (1.778 m)   Wt 87.1 kg   SpO2 100%   BMI 27.55 kg/m   Physical Exam Vitals and nursing note reviewed.  Constitutional:      General: He is not in acute distress.    Appearance: He is well-developed. He is not ill-appearing, toxic-appearing or diaphoretic.  HENT:     Head: Normocephalic and atraumatic.     Right Ear: External ear normal.     Left Ear: External ear normal.  Eyes:     Conjunctiva/sclera: Conjunctivae normal.     Pupils: Pupils are equal, round, and reactive to light.  Neck:     Trachea: Phonation normal.  Cardiovascular:     Rate and Rhythm: Normal rate.  Pulmonary:     Effort: Pulmonary effort is normal. No respiratory distress.     Breath sounds: No stridor.  Abdominal:     General: There is no distension.     Palpations: Abdomen is soft.     Tenderness: There is no abdominal tenderness.  Musculoskeletal:        General: Normal range of motion.     Cervical back: Normal range of motion and neck supple.  Skin:    General: Skin is warm and dry.  Neurological:     Mental Status: He is  alert and oriented to person, place, and time.     Cranial Nerves: No cranial nerve deficit.     Sensory: No sensory deficit.     Motor: No abnormal muscle tone.     Coordination: Coordination normal.  Psychiatric:        Mood and Affect: Mood normal.        Behavior: Behavior normal.        Thought Content: Thought content normal.        Judgment: Judgment normal.    ED Results / Procedures / Treatments   Labs (all labs ordered are listed, but only abnormal results are displayed) Labs Reviewed  COMPREHENSIVE METABOLIC PANEL - Abnormal; Notable for the following components:      Result Value   Sodium 133 (*)    Potassium 3.4 (*)    CO2 20 (*)    Glucose, Bld 101 (*)    All other components within normal limits  URINALYSIS, ROUTINE W REFLEX MICROSCOPIC -  Abnormal; Notable for the following components:   Hgb urine dipstick MODERATE (*)    Ketones, ur 20 (*)    Protein, ur 30 (*)    Bacteria, UA RARE (*)    All other components within normal limits  RESP PANEL BY RT-PCR (FLU A&B, COVID) ARPGX2  LIPASE, BLOOD  CBC    EKG None  Radiology No results found.  Procedures Procedures   Medications Ordered in ED Medications - No data to display  ED Course  I have reviewed the triage vital signs and the nursing notes.  Pertinent labs & imaging results that were available during my care of the patient were reviewed by me and considered in my medical decision making (see chart for details).    MDM Rules/Calculators/A&P                            Patient Vitals for the past 24 hrs:  BP Temp Temp src Pulse Resp SpO2 Height Weight  03/09/21 1148 109/70 -- -- 69 18 100 % -- --  03/09/21 0839 (!) 126/95 97.9 F (36.6 C) Oral 89 16 97 % 5\' 10"  (1.778 m) 87.1 kg    4:44 PM Reevaluation with update and discussion. After initial assessment and treatment, an updated evaluation reveals no change in clinical status, findings are supravaginal questions were answered.   Medical Decision Making:  This patient is presenting for evaluation of diarrhea for 2 days, which does require a range of treatment options, and is a complaint that involves a moderate risk of morbidity and mortality. The differential diagnoses include gastroenteritis, viral process, bacterial infection, food poisoning. I decided to review old records, and in summary healthy male presenting with nonspecific symptoms short-term without red flags.  I did not require additional historical information from anyone.  Clinical Laboratory Tests Ordered, included CBC, Metabolic panel, Urinalysis, and viral panel . Review indicates normal except sodium slightly low, potassium slightly low, CO2 low, glucose slightly elevated.   Critical Interventions-clinical evaluation,  laboratory testing, observation and reassessment  After These Interventions, the Patient was reevaluated and was found stable during ED evaluation.  Signs are normal.  Patient with very short-term illness, characterized primarily by diarrhea.  No indication for further ED evaluation, additional laboratory testing or other interventions.  Symptomatic care is indicated at this point.  CRITICAL CARE-no Performed by: Mancel Bale  Nursing Notes Reviewed/ Care Coordinated Applicable Imaging Reviewed Interpretation of Laboratory Data  incorporated into ED treatment  The patient appears reasonably screened and/or stabilized for discharge and I doubt any other medical condition or other [facility] requiring further screening, evaluation, or treatment in the ED at this time prior to discharge.  Plan: Home Medications-use Imodium or prescribed Lomotil; Home Treatments-low fiber diet; return here if the recommended treatment, does not improve the symptoms; Recommended follow up-PCP,.     Final Clinical Impression(s) / ED Diagnoses Final diagnoses:  Diarrhea, unspecified type    Rx / DC Orders ED Discharge Orders          Ordered    diphenoxylate-atropine (LOMOTIL) 2.5-0.025 MG tablet  4 times daily PRN        03/09/21 1642             Mancel Bale, MD 03/09/21 1646

## 2021-03-09 NOTE — ED Triage Notes (Signed)
Pt states diarrhea for over 24 hours. Pt describes twisting in his stomach, along with nausea. Pt states he has no appetite. No relief with loperamide.

## 2021-03-09 NOTE — ED Provider Notes (Signed)
Emergency Medicine Provider Triage Evaluation Note  Anthony Maxwell , a 25 y.o. male  was evaluated in triage.  Pt complains of numerous episodes of nonbloody diarrhea x1 day.  Patient states diarrhea was preceded with body aches.  Denies cough, sore throat, nasal congestion.  No sick contacts or COVID exposures.  Admits to nausea however, denies vomiting.  Endorses a subjective fever yesterday.  He has been taking Imodium with no relief. Admits to some abdominal pain. No urinary symptoms.   Review of Systems  Positive: Fever, myalgias, abdominal pain, diarrhea, nausea Negative: CP  Physical Exam  BP (!) 126/95 (BP Location: Left Arm)   Pulse 89   Temp 97.9 F (36.6 C) (Oral)   Resp 16   Ht 5\' 10"  (1.778 m)   Wt 87.1 kg   SpO2 97%   BMI 27.55 kg/m  Gen:   Awake, no distress   Resp:  Normal effort  MSK:   Moves extremities without difficulty  Other:  Abdomen soft, nondistended, nontender to palpation in all quadrants without guarding or peritoneal signs. No rebound.   Medical Decision Making  Medically screening exam initiated at 8:45 AM.  Appropriate orders placed.  Milewski was informed that the remainder of the evaluation will be completed by another provider, this initial triage assessment does not replace that evaluation, and the importance of remaining in the ED until their evaluation is complete.  Labs COVID test   Bonney Aid 03/09/21 0848    03/11/21, MD 03/09/21 0900

## 2021-03-09 NOTE — Discharge Instructions (Signed)
It is not clear what is causing your diarrhea.  The test that we did today did not show any serious problems to indicate either dehydration, or a bacterial infection.  This could be a virus or possibly food poisoning.  To improve your condition, use a diet which is low fiber.  Also, drink plenty of fluids to improve your hydration status.  You can use Imodium, 4 mg, 4 times a day to help with the diarrhea.  We are also giving you a prescription for Lomotil which is stronger and can help to treat symptoms of diarrhea that are not controlled by Imodium.  Follow-up with the doctor of your choice if not better in 4 to 5 days.

## 2021-03-24 ENCOUNTER — Ambulatory Visit (INDEPENDENT_AMBULATORY_CARE_PROVIDER_SITE_OTHER): Payer: Medicaid Other | Admitting: Internal Medicine

## 2021-03-24 ENCOUNTER — Encounter: Payer: Self-pay | Admitting: Internal Medicine

## 2021-03-24 ENCOUNTER — Other Ambulatory Visit: Payer: Self-pay | Admitting: Internal Medicine

## 2021-03-24 ENCOUNTER — Other Ambulatory Visit: Payer: Self-pay

## 2021-03-24 VITALS — BP 117/58 | HR 91 | Temp 98.1°F | Resp 28 | Ht 70.0 in | Wt 184.7 lb

## 2021-03-24 DIAGNOSIS — Z23 Encounter for immunization: Secondary | ICD-10-CM | POA: Diagnosis present

## 2021-03-24 DIAGNOSIS — R21 Rash and other nonspecific skin eruption: Secondary | ICD-10-CM | POA: Diagnosis not present

## 2021-03-24 DIAGNOSIS — Z0000 Encounter for general adult medical examination without abnormal findings: Secondary | ICD-10-CM | POA: Diagnosis not present

## 2021-03-24 DIAGNOSIS — R197 Diarrhea, unspecified: Secondary | ICD-10-CM

## 2021-03-24 DIAGNOSIS — K219 Gastro-esophageal reflux disease without esophagitis: Secondary | ICD-10-CM

## 2021-03-24 DIAGNOSIS — B360 Pityriasis versicolor: Secondary | ICD-10-CM

## 2021-03-24 MED ORDER — FAMOTIDINE 20 MG PO TABS: 20.0000 mg | ORAL_TABLET | Freq: Two times a day (BID) | ORAL | 1 refills | Status: DC

## 2021-03-24 MED ORDER — KETOCONAZOLE 2 % EX CREA: 1.0000 "application " | TOPICAL_CREAM | Freq: Every day | CUTANEOUS | 0 refills | Status: AC

## 2021-03-24 NOTE — Assessment & Plan Note (Signed)
Patient states that he has sour taste in mouth for last few weeks and heartburn.  He states that symptoms are not daily.  He has not taken medication for acid reflux in the past, but has had H. Pylori infection about 7 years ago.  This was treated and patient states that stool test following treatment said he no longer had H.pylori.  Plan: - Famotidine 20 mg BID - May consider Stool antigen testing for H pylori if symptoms persist - Follow-up in four weeks

## 2021-03-24 NOTE — Patient Instructions (Addendum)
Anthony Maxwell [facility], it was a pleasure seeing you today!  Today we discussed: Diarrhea- You likely had a virus that caused your symptoms.  It may take your body a bit to return to normal.  If you start to have diarrhea more frequently, please call the clinic and we will do further testing.  Rash- You have a fungal infection.  I sent in a cream called ketoconazole to treat this.  Acid reflux- Please start taking famotidine 40 mg daily and follow-up in four weeks.  I have ordered the following labs today:  Lab Orders  No laboratory test(s) ordered today      Referrals ordered today:   Referral Orders  No referral(s) requested today     I have ordered the following medication/changed the following medications:   Stop the following medications: There are no discontinued medications.   Start the following medications: No orders of the defined types were placed in this encounter.    Follow-up:  4 weeks    Please make sure to arrive 15 minutes prior to your next appointment. If you arrive late, you may be asked to reschedule.   We look forward to seeing you next time. Please call our clinic at [phone] if you have any questions or concerns. The best time to call is Monday-Friday from 9am-4pm, but there is someone available 24/7. If after hours or the weekend, call the main hospital number and ask for the Internal Medicine Resident On-Call. If you need medication refills, please notify your pharmacy one week in advance and they will send [place] a request.  Thank you for letting us take part in your care. Wishing you the best!  Thank you, Dr. [facility]

## 2021-03-24 NOTE — Assessment & Plan Note (Signed)
Received flu vaccine today

## 2021-03-24 NOTE — Assessment & Plan Note (Signed)
Patient presents due to itchy rash. He states that this has been present for the last few months.  It is on his arms, torso, back, and legs. He was seen in August for rash at another clinic and given 5 day course of prednisone 40 mg and triamcinolone cream without improvement in rash.  He states that he has a similar rash several years ago and was given a cream with resolution of rash.  On physical exam, diffuse hypopigmented, small round patches present on arms, legs, back and torso.    Rash is most likely Tinea versicolor. Plan: - Ketoconazole 2% cream applied once daily for 14 days

## 2021-03-24 NOTE — Progress Notes (Signed)
    Subjective:  CC: to establish care and diarrhea, rash, acid reflux  HPI:  [name] is a 25 y.o. male with a past medical history stated below and presents today to establish care and diarrhea, rash, acid reflux. Please see problem based assessment and plan for additional details.  No past medical history on file.  Current Outpatient Medications on File Prior to Visit  Medication Sig Dispense Refill   diphenhydrAMINE (BENADRYL ALLERGY) 25 MG tablet Take 1 tablet (25 mg total) by mouth every 6 (six) hours as needed for itching. 30 tablet 0   diphenoxylate-atropine (LOMOTIL) 2.5-0.025 MG tablet Take 1 tablet by mouth 4 (four) times daily as needed for diarrhea or loose stools. 20 tablet 0   triamcinolone cream (KENALOG) 0.1 % Apply 1 application topically 2 (two) times daily. (Patient not taking: Reported on 01/10/2021) 453.6 g 5   No current facility-administered medications on file prior to visit.    No family history on file.  Social History   Socioeconomic History   Marital status: Married    Spouse name: Not on file   Number of children: 1   Years of education: Not on file   Highest education level: Not on file  Occupational History   Not on file  Tobacco Use   Smoking status: Never   Smokeless tobacco: Never  Substance and Sexual Activity   Alcohol use: Never   Drug use: Not Currently   Sexual activity: Yes  Other Topics Concern   Not on file  Social History Narrative   Not on file   Social Determinants of Health   Financial Resource Strain: Not on file  Food Insecurity: Not on file  Transportation Needs: Not on file  Physical Activity: Not on file  Stress: Not on file  Social Connections: Not on file  Intimate Partner Violence: Not on file    Review of Systems: ROS negative except for what is noted on the assessment and plan.  Objective:   Vitals:   03/24/21 0931  BP: (!) 117/58  Pulse: 91  Resp: (!) 28  Temp: 98.1 F (36.7 C)   TempSrc: Oral  SpO2: 97%  Weight: 184 lb 11.2 oz (83.8 kg)  Height: 5\' 10"  (1.778 m)    Physical Exam: Gen: A&O x3 and in no apparent distress, well appearing and nourished. HEENT:    Head - normocephalic, atraumatic.    Eye - visual acuity grossly intact, conjunctiva clear, sclera non-icteric, EOM intact.    Mouth - No obvious caries or periodontal disease. Neck: no masses or nodules, AROM intact. CV: RRR, no murmurs, S1/S2 presents  Resp: Clear to ascultation bilaterally  Abd: BS (+) x4, soft, non-tender abdomen, without hepatosplenomegaly or masses MSK: Grossly normal AROM and strength x4 extremities. Skin: diffuse, hypopigmented, circular, circumscribed patches present on forearms, back, torso, and legs. Neuro: No focal deficits, grossly normal sensation and coordination.  Psych: Oriented x3 and responding appropriately. Intact memory, normal mood, judgement, affect, and insight.    Assessment & Plan:  See Encounters Tab for problem based charting.  Patient seen with Dr. Gerry Blanchfield, D.O. [facility]  PGY-1 Pager: [phone]  Phone: [phone] Date 03/24/2021  Time 1:55 PM

## 2021-03-24 NOTE — Assessment & Plan Note (Addendum)
Patient presents with diarrhea for the last 3 weeks.  He presented to the ED on October 27 due to diarrhea.  At that time, he was going 5-6 times at night and a couple times during the day.  He described diarrhea as watery. On lab evaluation in the ED, CBC, BMP, UA, and viral panel were normal outside of sodium at 133, K+ at 3.4, and CO2 at 20.  He was sent home with Lomotil.  He states that diarrhea has gradually improved over the last 2 weeks.  He now has diarrhea about 2 times weekly, but continues to show bloated.   Assessment: Presentation of acute diarrhea that is gradually improving is suggestive of infective process, likely gastroenteritis from a virus.  We will follow-up with patient in about 4 weeks, if diarrhea is persistent or has worsened will consider additional differentials including IBD or malabsorption causes of diarrhea.  Plan: Follow-up in 4 weeks

## 2021-03-27 NOTE — Progress Notes (Signed)
Internal Medicine Clinic Attending  I saw and evaluated the patient.  I personally confirmed the key portions of the history and exam documented by Dr. Masters and I reviewed pertinent patient test results.  The assessment, diagnosis, and plan were formulated together and I agree with the documentation in the resident's note.  

## 2021-07-11 ENCOUNTER — Ambulatory Visit: Payer: Medicaid Other | Admitting: Nurse Practitioner

## 2022-07-24 IMAGING — DX DG KNEE AP/LAT W/ SUNRISE*L*
3 series · 3 of 3 positions shown · non-contrast
Comparison: None.

CLINICAL DATA: Chronic anterior left knee pain increasing with
running and activity.

EXAM:
LEFT KNEE 3 VIEWS

[knee ap]
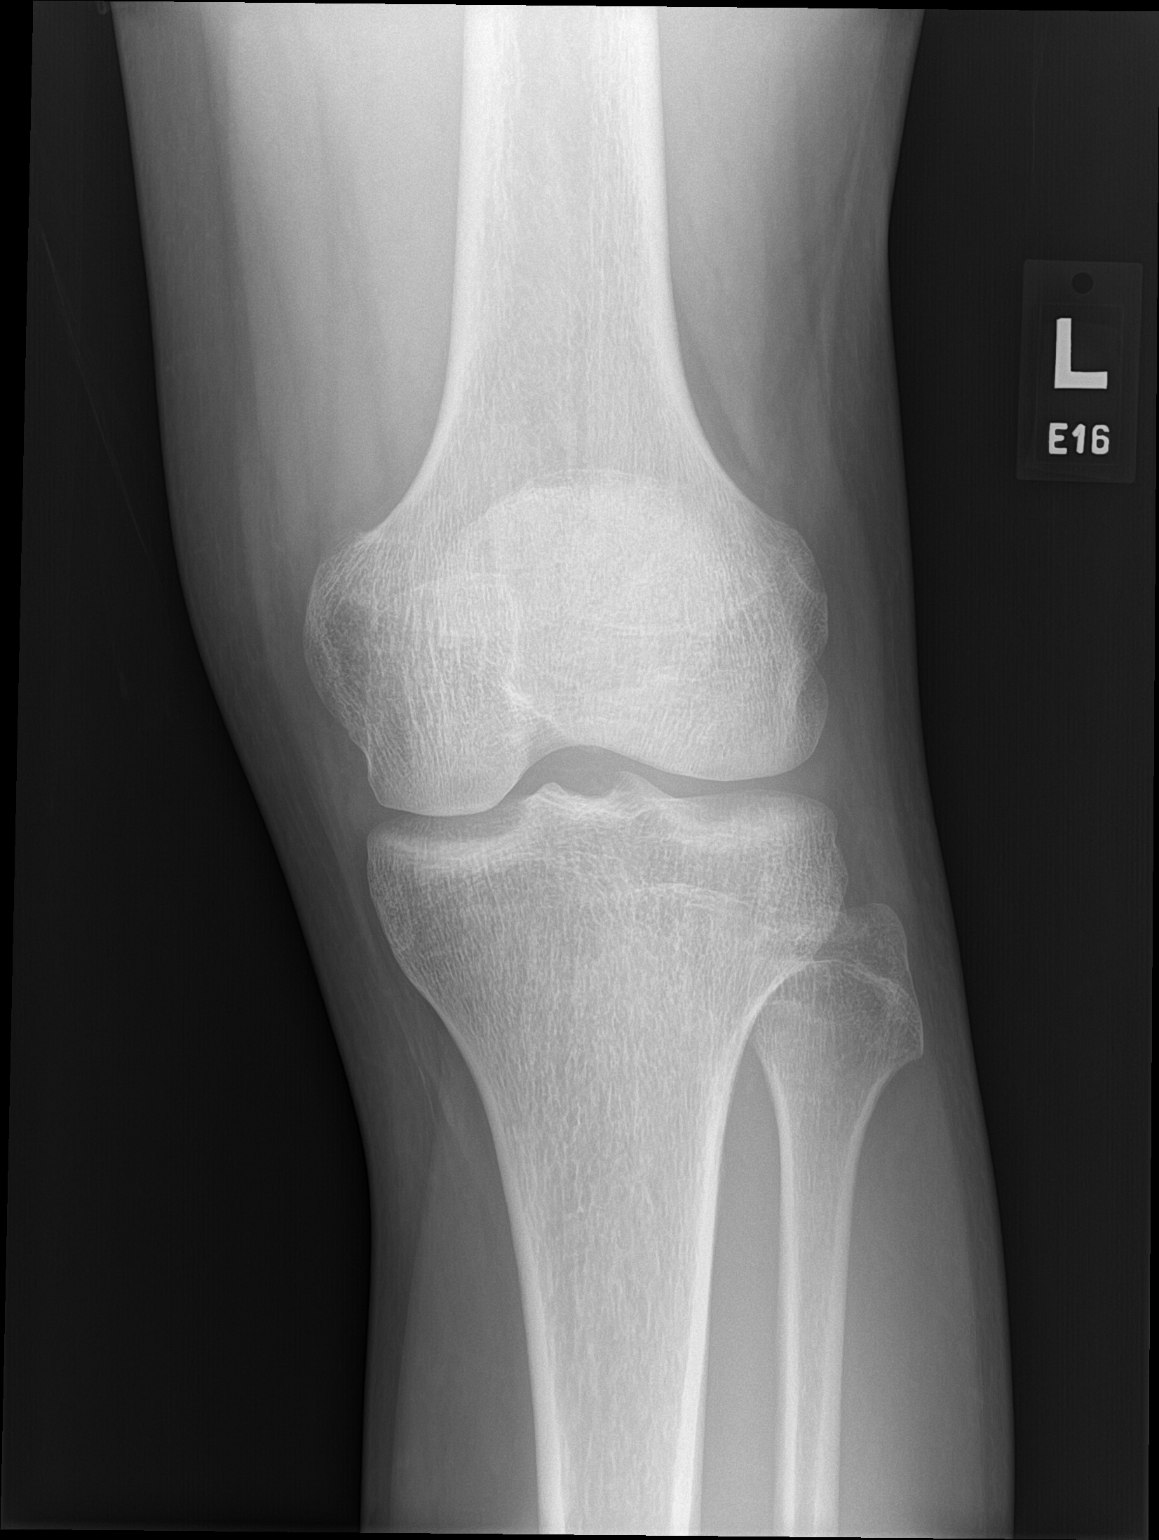

[knee lat]
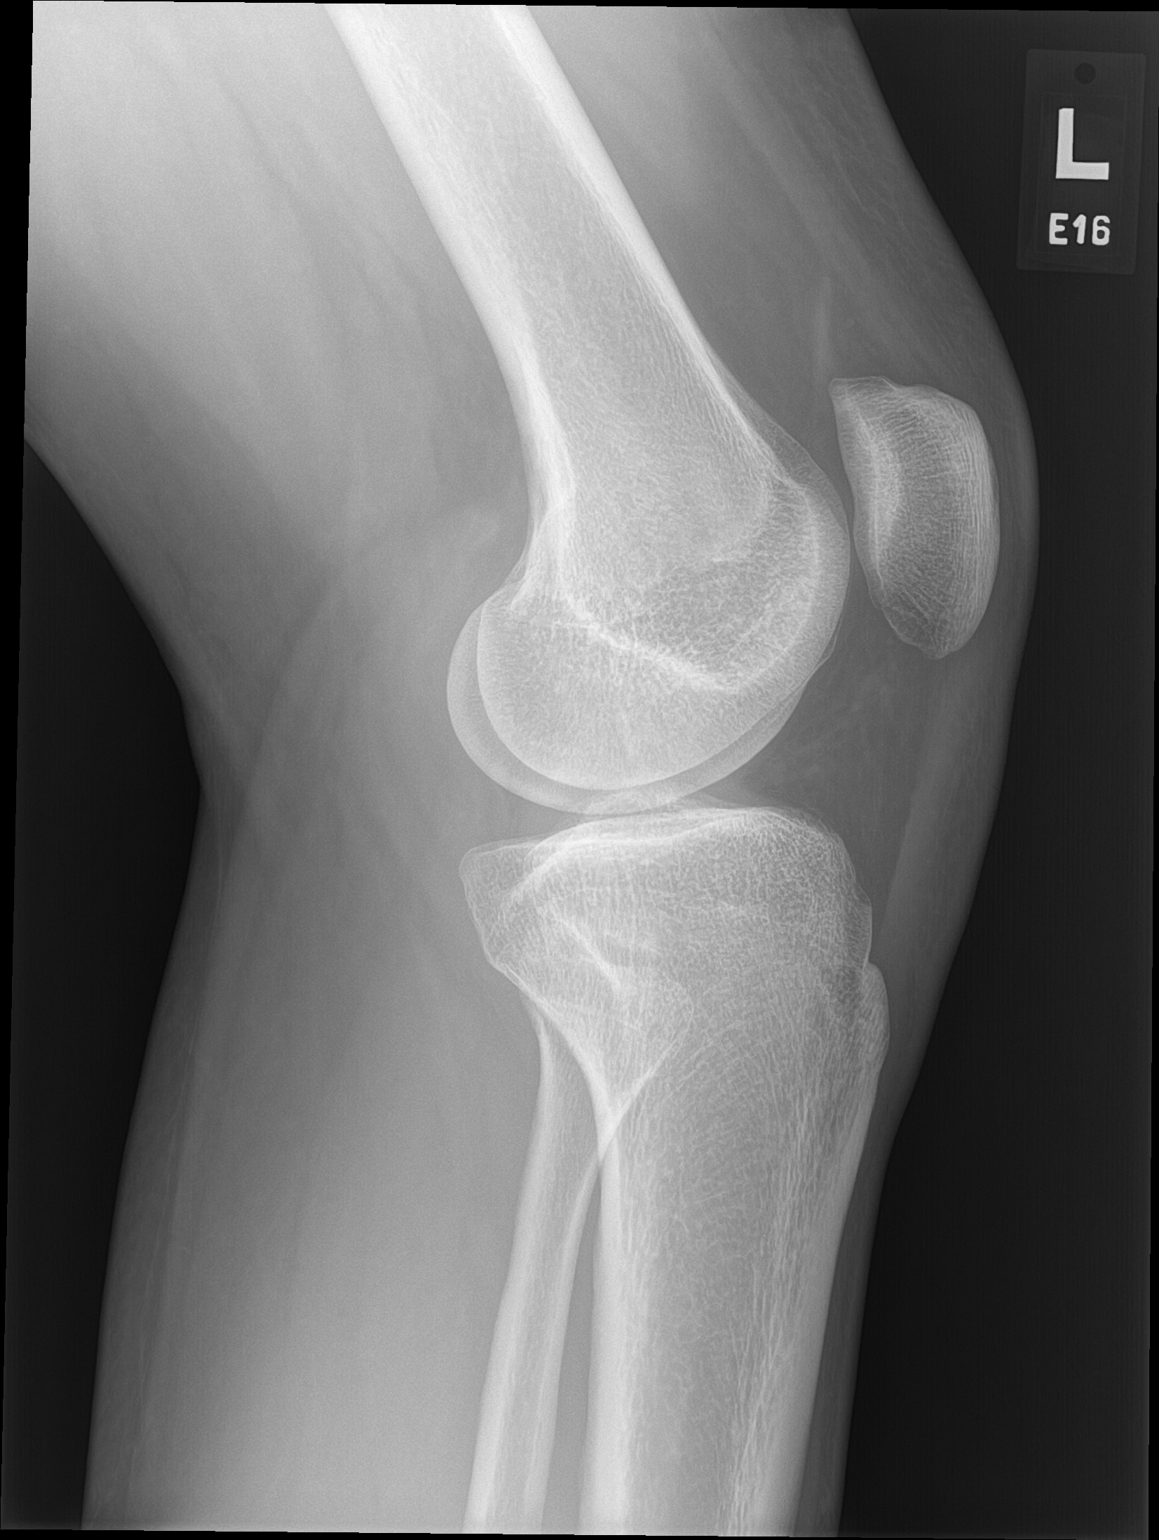

[patella]
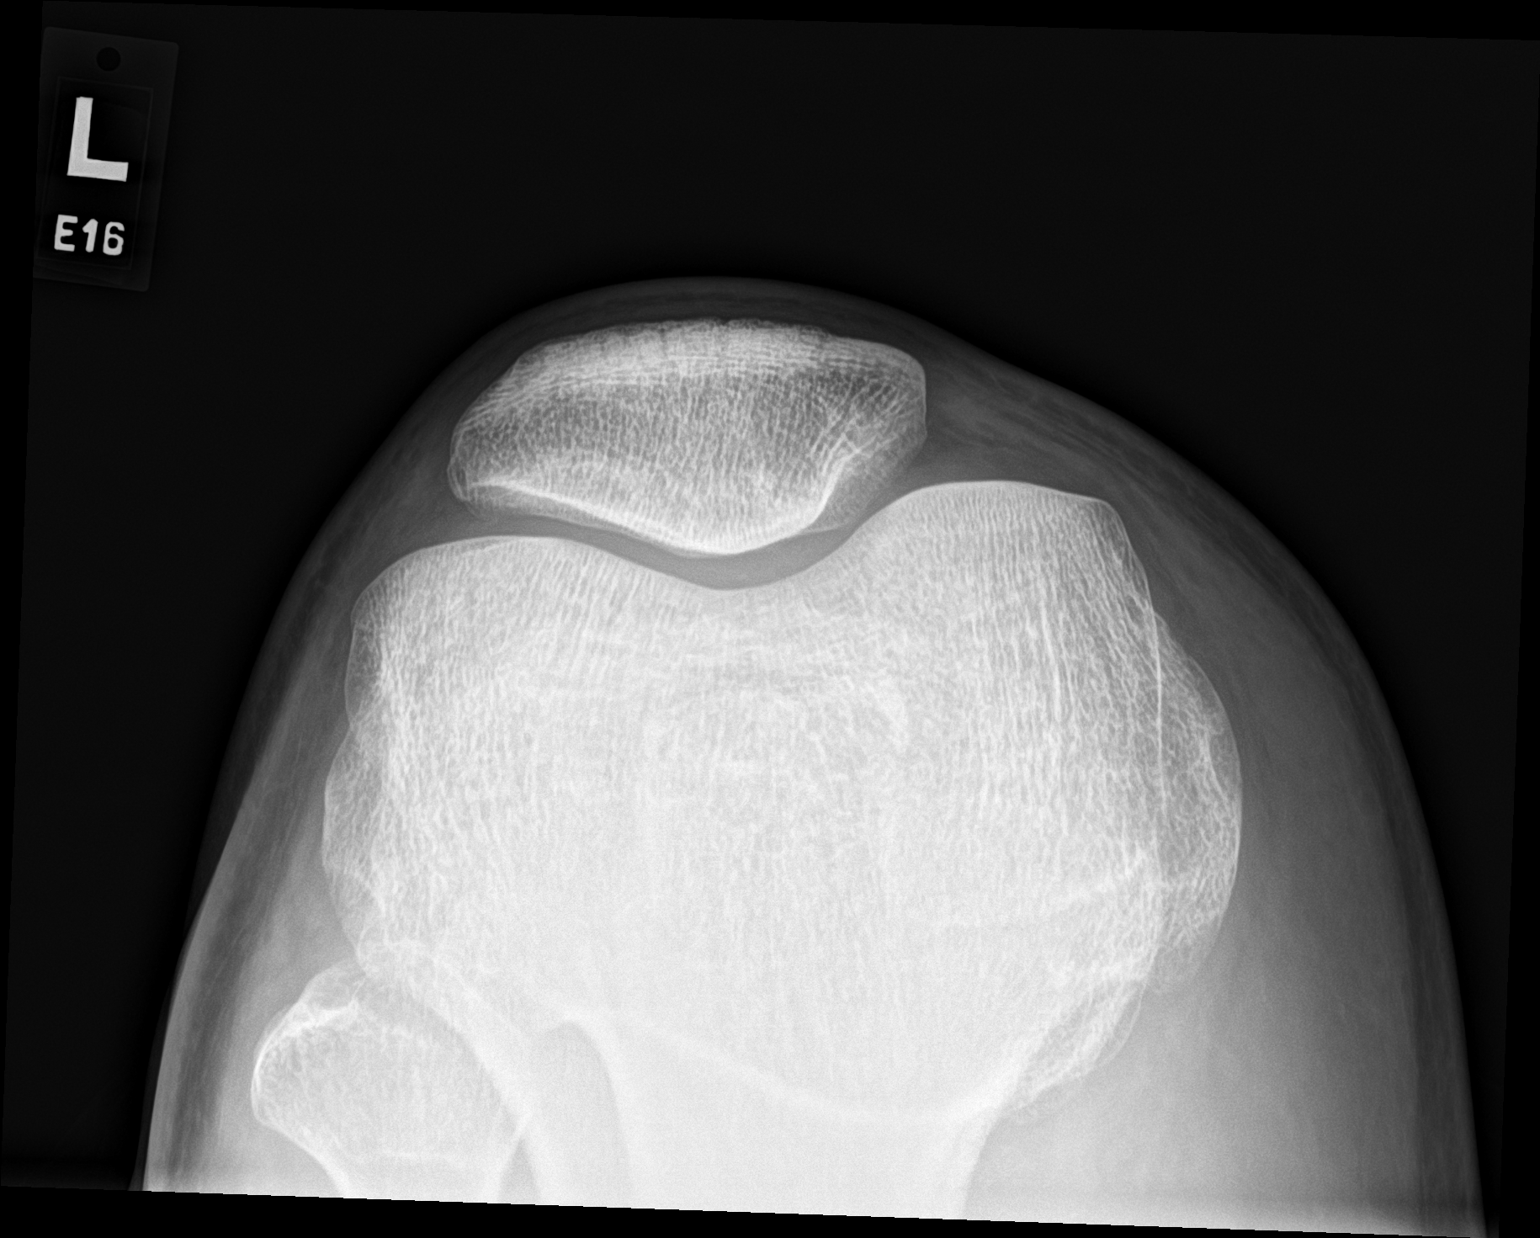

[3 of 3 positions shown; findings below may reference images not displayed]

FINDINGS: No evidence of fracture, dislocation, or joint effusion. Normal
joint spaces and alignment. Patella normally situated in the
trochlear groove. No evidence of arthropathy or other focal bone
abnormality. Soft tissues are unremarkable.
IMPRESSION: Negative radiographs of the left knee.

## 2022-08-23 NOTE — Progress Notes (Signed)
[facility] VISIT                    [facility]                                 Date: 08/24/2022 4:08 PM   Patient ID: Blake Monroe is a 27 y.o. male.    Chief Complaint:  Chief Complaint   Patient presents with    Hair/Scalp Problem     Rash on scalp and shoulder.     Vitamin D Deficiency     Recheck levels.        HPI:  The patient is a 27 year old male who presented here for the first time to establish care and follow-up on some concerns.  Reports having previously diagnosed with dermatitis by his primary care provider reports improvement of symptoms with topical steroids and zinc shampoo and will need refills today.  Patient also had concern about the swelling on his head that has not increased in size, is nontender, and when he attempted to drain it [by placing a clot with a clean blade] no discharge was noted.  The patient reports this is concerning to him as the swelling started as a small bump and gradually increase to current size.  Patient denies any headache, similar bones and other parts of his body, blurry vision.    Problem List:  Patient Active Problem List   Diagnosis    Seborrheic dermatitis    Low vitamin D level       Current Medications:  Outpatient Medications Marked as Taking for the 08/24/22 encounter (Office Visit) with Thurman Coyer, MD   Medication Sig Dispense Refill    VITAMIN D PO Take 5,000 mcg by mouth daily 3 tabs daily.         Allergies:  No Known Allergies    Past Medical History:  Past Medical History:   Diagnosis Date    Vitamin D deficiency        Past Surgical History:  History reviewed. No pertinent surgical history.    Family History:  Family History   Problem Relation Age of Onset    No known problems Mother     No known problems Father        Social History:  Social History     Tobacco Use    Smoking status: Never    Smokeless tobacco: Never   Vaping Use    Vaping status: Never Used   Substance Use Topics    Alcohol use: Never     Drug use: Never          The following sections were reviewed this encounter by the provider:   Tobacco  Allergies  Meds  Problems  Med Hx  Surg Hx  Fam Hx           ROS:  Review of Systems   Constitutional:  Negative for chills, fatigue and fever.   HENT:  Negative for congestion, hearing loss, rhinorrhea, sinus pressure and sore throat.    Eyes:  Negative for pain and visual disturbance.   Respiratory:  Negative for apnea, cough, choking, chest tightness, shortness of breath, wheezing and stridor.    Cardiovascular:  Negative for chest pain, palpitations and leg swelling.   Gastrointestinal:  Negative for abdominal distention, abdominal pain, diarrhea, nausea and vomiting.   Endocrine: Negative for polydipsia, polyphagia and polyuria.  Genitourinary:  Negative for difficulty urinating, dysuria and frequency.   Musculoskeletal:  Negative for arthralgias, back pain, gait problem and myalgias.   Skin:  Negative for pallor and positive for scalp swelling and generalized body rash.   Neurological:  Negative for weakness, light-headedness, numbness and headaches.   Psychiatric/Behavioral:  Negative for agitation and sleep disturbance.    Objective:   Vitals:  BP 95/62 (BP Site: Left arm, Patient Position: Sitting, Cuff Size: Medium)   Pulse 85   Temp 97.2 F (36.2 C) (Temporal)   Resp 16   Ht 1.816 m (5' 11.5")   Wt 88.9 kg (196 lb)   SpO2 97%   BMI 26.96 kg/m       Physical Exam:  General Examination:   Physical Exam   General Examination:   Vitals and nursing note reviewed.   Constitutional:       General: He is not in acute distress.     Appearance: Normal appearance.   HENT:      Head: Normocephalic and atraumatic.      Right Ear: Tympanic membrane, ear canal and external ear normal.      Left Ear: Tympanic membrane, ear canal and external ear normal.      Nose: Nose normal.      Mouth/Throat:      Mouth: Mucous membranes are moist.      Pharynx: Oropharynx is clear.   Eyes:      Extraocular  Movements: Extraocular movements intact.      Pupils: Pupils are equal, round, and reactive to light.   Cardiovascular:      Rate and Rhythm: Normal rate and regular rhythm.      Pulses: Normal pulses.      Heart sounds: Normal heart sounds.   Pulmonary:      Effort: Pulmonary effort is normal.      Breath sounds: Normal breath sounds.   Abdominal:      General: There is no distension.      Palpations: Abdomen is soft. There is no mass.      Tenderness: There is no abdominal tenderness. There is no right CVA tenderness or left CVA tenderness.   Musculoskeletal:         General: No tenderness. Normal range of motion.      Cervical back: Normal range of motion and neck supple.      Right lower leg: No edema.      Left lower leg: No edema.   Skin:     General: Skin is warm and dry.  Generalized pruritic, erythematous rash noted on patient's chest, neck and scalp.     A painless nontender subcutaneous nodule about 4 x 3 cm cm, oval, moves readily with slight finger pressure.  No erythema, no differential warmth.  Does not appear to be attached to the overlying skin or underlying tissue.  Patient attempted to incise [linear cut] noted on exam.     Capillary Refill: Capillary refill takes less than 2 seconds.      Coloration: Skin is not jaundiced or pale.     Neurological:      General: No focal deficit present.      Mental Status: He is alert and oriented to person, place, and time.   Psychiatric:         Mood and Affect: Mood normal.         Behavior: Behavior normal.       Assessment and Plan:  1. Lipoma of head  Likely lipoma versus epidermoid cyst  Counseled patient on findings, we discussed that he will benefit from evaluation by general surgeons, referral placed in chart.  - Referral to General Surgery ([place]); Future    2. Seborrheic dermatitis  Patient reports improvement of symptoms.  Who prescribed medications, I will send in refills today.  Given widespread nature of rash, Medrol Dosepak ordered  today.  - Clobetasol Propionate 0.15 MG/ACT (0.05%) Lotion; Apply 1 Application topically daily as needed (Rashes)  Dispense: 68 g; Refill: 1  - Pyrithione Zinc 2 % Shampoo; Apply 1 Application topically daily as needed (rashes)  Dispense: 480 mL; Refill: 1  - methylPREDNISolone (MEDROL DOSEPAK) 4 MG tablet; Follow Package Directions.  Dispense: 21 tablet; Refill: 0  - CBC without differential; Future  - Comprehensive metabolic panel; Future    3. Low vitamin D level  Previously diagnosed, recheck levels today.  - Vitamin D,25 [place], Total; Future          Follow-up:   No follow-ups on file.       Thurman Coyer, MD     This note was generated within the EPIC EMR using Dragon medical speech recognition software and may contain inherent errors or omissions not intended by the user. Grammatical and punctuation errors, random word insertions, deletions, pronoun errors and incomplete sentences are occasional consequences of this technology due to software limitations. Not all errors are caught or corrected.  Although every attempt is made to root out erroneus and incomplete transcription, the note may still not fully represent the intent or opinion of the author. If there are questions or concerns about the content of this note or information contained within the body of this dictation they should be addressed directly with the author for clarification.

## 2022-08-24 ENCOUNTER — Ambulatory Visit (INDEPENDENT_AMBULATORY_CARE_PROVIDER_SITE_OTHER): Payer: BC Managed Care – PPO | Admitting: Student in an Organized Health Care Education/Training Program

## 2022-08-24 ENCOUNTER — Encounter (INDEPENDENT_AMBULATORY_CARE_PROVIDER_SITE_OTHER): Payer: Self-pay | Admitting: Student in an Organized Health Care Education/Training Program

## 2022-08-24 VITALS — BP 95/62 | HR 85 | Temp 97.2°F | Resp 16 | Ht 71.5 in | Wt 196.0 lb

## 2022-08-24 DIAGNOSIS — L219 Seborrheic dermatitis, unspecified: Secondary | ICD-10-CM | POA: Insufficient documentation

## 2022-08-24 DIAGNOSIS — D170 Benign lipomatous neoplasm of skin and subcutaneous tissue of head, face and neck: Secondary | ICD-10-CM

## 2022-08-24 DIAGNOSIS — R7989 Other specified abnormal findings of blood chemistry: Secondary | ICD-10-CM | POA: Insufficient documentation

## 2022-08-24 LAB — COMPREHENSIVE METABOLIC PANEL
ALT: 16 U/L (ref 0–55)
AST (SGOT): 18 U/L (ref 5–41)
Albumin/Globulin Ratio: 1.5 (ref 0.9–2.2)
Albumin: 4.6 g/dL (ref 3.5–5.0)
Alkaline Phosphatase: 52 U/L (ref 37–117)
Anion Gap: 5 (ref 5.0–15.0)
BUN: 20 mg/dL (ref 9.0–28.0)
Bilirubin, Total: 0.5 mg/dL (ref 0.2–1.2)
CO2: 28 mEq/L (ref 17–29)
Calcium: 9.8 mg/dL (ref 8.5–10.5)
Chloride: 106 mEq/L (ref 99–111)
Creatinine: 1 mg/dL (ref 0.5–1.5)
Globulin: 3.1 g/dL (ref 2.0–3.6)
Glucose: 82 mg/dL (ref 70–100)
Potassium: 4.2 mEq/L (ref 3.5–5.3)
Protein, Total: 7.7 g/dL (ref 6.0–8.3)
Sodium: 139 mEq/L (ref 135–145)
eGFR: >60 mL/min/{1.73_m2} (ref >=60)

## 2022-08-24 LAB — CBC
Absolute NRBC: 0 10*3/uL (ref 0.00–0.00)
Hematocrit: 43.4 % (ref 37.6–49.6)
Hgb: 15.3 g/dL (ref 12.5–17.1)
MCH: 30.4 pg (ref 25.1–33.5)
MCHC: 35.3 g/dL (ref 31.5–35.8)
MCV: 86.1 fL (ref 78.0–96.0)
MPV: 11.2 fL (ref 8.9–12.5)
Nucleated RBC: 0 /100 WBC (ref 0.0–0.0)
Platelets: 218 10*3/uL (ref 142–346)
RBC: 5.04 10*6/uL (ref 4.20–5.90)
RDW: 12 % (ref 11–15)
WBC: 7.67 10*3/uL (ref 3.10–9.50)

## 2022-08-24 LAB — VITAMIN D, 25 OH, TOTAL: Vitamin D, 25 OH, Total: 34 ng/mL (ref 30–100)

## 2022-08-24 LAB — HEMOLYSIS INDEX(SOFT): Hemolysis Index: 8 Index (ref 0–24)

## 2022-08-24 MED ORDER — METHYLPREDNISOLONE 4 MG PO TBPK
ORAL_TABLET | ORAL | 0 refills | Status: AC
Start: 2022-08-24 — End: 2022-08-30

## 2022-08-24 MED ORDER — CLOBETASOL PROPIONATE 0.15 MG/ACT (0.05%) EX LOTN
1.0000 | TOPICAL_LOTION | Freq: Every day | CUTANEOUS | 1 refills | Status: DC | PRN
Start: 2022-08-24 — End: 2023-11-24

## 2022-08-24 MED ORDER — PYRITHIONE ZINC 2 % EX SHAM
1.0000 | MEDICATED_SHAMPOO | Freq: Every day | CUTANEOUS | 1 refills | Status: DC | PRN
Start: 2022-08-24 — End: 2023-11-24

## 2022-08-26 NOTE — Progress Notes (Signed)
Lab results all within reassuring ranges. Please remember to follow up with the Gen surgeons for the swelling on your head.   If you have further questions or clarification is needed please contact the office.

## 2022-08-28 NOTE — Progress Notes (Signed)
Pt informed and verbalized understanding

## 2022-09-11 ENCOUNTER — Encounter (INDEPENDENT_AMBULATORY_CARE_PROVIDER_SITE_OTHER): Payer: Self-pay | Admitting: Surgery

## 2022-09-11 ENCOUNTER — Ambulatory Visit (INDEPENDENT_AMBULATORY_CARE_PROVIDER_SITE_OTHER): Payer: BC Managed Care – PPO | Admitting: Surgery

## 2022-09-11 VITALS — BP 101/66 | HR 71 | Temp 98.2°F | Ht 71.5 in | Wt 200.4 lb

## 2022-09-11 DIAGNOSIS — D485 Neoplasm of uncertain behavior of skin: Secondary | ICD-10-CM

## 2022-09-11 DIAGNOSIS — D170 Benign lipomatous neoplasm of skin and subcutaneous tissue of head, face and neck: Secondary | ICD-10-CM

## 2022-09-11 NOTE — Progress Notes (Unsigned)
Visit Date Time: 09/11/2022  6:13 PM     Referring Provider: Thurman Coyer, MD Thurman Coyer, MD  [facility] Provider: Despina Hidden, MD    Service: General Surgery    Reason For Visit:  Soft tissue mass      History of Present Illness:   Blake Monroe is a 27 y.o. male who  has a past medical history of Vitamin D deficiency.Marland Kitchen  He presents with several masses.  Location(s) include scalp.  The mass(es) have been present for several months.  There has been enlargement.  Associated symptoms include: none.  Pt denies pain and drainage.  Prior removal has not been performed.    The following data was personally reviewed during the evaluation of this problem:  previous medical records/operative notes    Past Medical History:     Past Medical History:   Diagnosis Date    Vitamin D deficiency        Past Surgical History:   History reviewed. No pertinent surgical history.    Family History:     Family History   Problem Relation Age of Onset    No known problems Mother     No known problems Father        Social History:     Social History     Socioeconomic History    Marital status: Unknown   Tobacco Use    Smoking status: Never    Smokeless tobacco: Never   Vaping Use    Vaping status: Never Used   Substance and Sexual Activity    Alcohol use: Never    Drug use: Never     Social Determinants of Health     Transportation Needs: No Transportation Needs (09/11/2022)    PRAPARE - Therapist, art (Medical): No     Lack of Transportation (Non-Medical): No   Physical Activity: Inactive (07/31/2021)    Received from [facility]    Exercise Vital Sign     Days of Exercise per Week: 0 days     Minutes of Exercise per Session: 0 min       Allergies:   Patient has no known allergies.    Medications:     Current Outpatient Medications   Medication Sig Dispense Refill    Clobetasol Propionate 0.15 MG/ACT (0.05%) Lotion Apply 1 Application topically daily as needed  (Rashes) 68 g 1    Pyrithione Zinc 2 % Shampoo Apply 1 Application topically daily as needed (rashes) 480 mL 1    VITAMIN D PO Take 5,000 mcg by mouth daily 3 tabs daily.       No current facility-administered medications for this visit.       Review of Systems:   ROS   As per the HPI and above. The patient otherwise denies any additional changes to their otic, opthalmologic, dermatologic, pulmonary, cardiac, gastrointestinal, genitourinary, musculoskeletal, hematologic, constitutional, or psychiatric systems.    Physical Exam:     Vitals:    09/11/22 1518   BP: 101/66   Pulse: 71   Temp: 98.2 F (36.8 C)   SpO2: 96%     Constitutional: Appears well, stated age.  Well nourished and well developed.  Eyes:  Conjunctiva and lids normal with no jaundice, PERRL.  Vision grossly intact.  Ear, Nose, Mouth and Throat:  Normal appearing external ears, nose and mouth.  Hearing grossly normal bilaterally.  Normal lips, teeth and gums.  Neck: Symmetric  with no masses, thyromegaly, JVD, adenopathy, bruit, tenderness.  Good range of motion.  Midline trachea.  Respiratory:  Normal respiration with no effort.  No audible wheezing.  Normal and symmetric breath sounds.  No abnormalities to tenderness or percussion.  CV: Normal heart sounds with no murmur.  Normal carotid and pedal pulses.  No peripheral edema.  Abdomen: nonobese abdomen, nontender and nondistended, with no masses, organomegaly, or hernias.  Lymphatic: No adenopathy of neck, axillae or groins.  Skin and soft tissue: Normal color and turgor.   Two areas on his scalp without hair and a subcutaneous fullness, well demarcated   Neuro: No gross deficits in cranial nerve function, sensation or motor function.  Psych: Normal judgement, insight, memory, mood, affect.  Oriented X 3.      Labs:   No results found for: "CBC", "BMP"     Rads:     Radiology Results (24 Hour)       ** No results found for the last 24 hours. **          No results found.   Impression:     Patient  Active Problem List   Diagnosis    Seborrheic dermatitis    Low vitamin D level    Neoplasm of uncertain behavior of skin     1. Lipoma of head  - Referral to General Surgery ([place])    2. Neoplasm of uncertain behavior of skin     Plan:     SCHEDULE OFFICE SURGERY: At this time, I would recommend proceeding with excision of  this/these lesion(s) for symptom control and/or pathologic determination. We reviewed the risks, to include potential recurrence, benefits, potential complications and alternatives. The patient understood and agreed to proceed. All questions were answered. We will schedule this office procedure at the patient's convenience and follow up post operatively to assess recovery and discuss their pathology findings.              Thank you for sending this patient to [facility] for evaluation.  We appreciate the opportunity to participate in their care.  We will address the issue for which they were sent, and afterward return the patient to your care and/or their PCP for any ongoing issues.  If you have any questions, do not hesitate to contact us at [phone].    Signed by: Despina Hidden, MD

## 2022-09-13 ENCOUNTER — Encounter (INDEPENDENT_AMBULATORY_CARE_PROVIDER_SITE_OTHER): Payer: Self-pay | Admitting: Surgery

## 2022-09-13 DIAGNOSIS — D485 Neoplasm of uncertain behavior of skin: Secondary | ICD-10-CM | POA: Insufficient documentation

## 2022-09-14 ENCOUNTER — Ambulatory Visit (INDEPENDENT_AMBULATORY_CARE_PROVIDER_SITE_OTHER): Payer: BC Managed Care – PPO | Admitting: Surgery

## 2022-10-09 ENCOUNTER — Ambulatory Visit (INDEPENDENT_AMBULATORY_CARE_PROVIDER_SITE_OTHER): Payer: BC Managed Care – PPO | Admitting: Surgery

## 2022-10-23 ENCOUNTER — Ambulatory Visit (INDEPENDENT_AMBULATORY_CARE_PROVIDER_SITE_OTHER): Payer: BC Managed Care – PPO | Admitting: Surgery

## 2022-11-06 ENCOUNTER — Ambulatory Visit (INDEPENDENT_AMBULATORY_CARE_PROVIDER_SITE_OTHER): Payer: BC Managed Care – PPO | Admitting: Surgery

## 2022-11-18 NOTE — Progress Notes (Unsigned)
[facility] ANNUAL PHYSICAL EXAMINATION               [facility]                             Subjective:      Date: 11/19/2022 9:00 PM   Patient ID: Blake Monroe is a 27 y.o. male.    Chief Complaint:  No chief complaint on file.      HPI  HPI   Visit Type: Health Maintenance Visit  Work Status: {clinical staff:35367}  Reported Health: {clinical staff:35368}  Reported Diet: {clinical staff:35275}  Reported Exercise: {clinical staff:35274}  Dental: {clinical staff:35369}  Vision: {clinical staff:35370}  Hearing: {clinical staff:35371}  Immunization Status: {clinical staff:35372}  Reproductive Health: {clinical staff:35373}  Prior Screening Tests: {PROVIDER:40468}  General Health Risks: {clinical staff:35375}  Safety Elements Used: {clinical staff:35376}    Problem List:  Patient Active Problem List   Diagnosis    Seborrheic dermatitis    Low vitamin D level    Neoplasm of uncertain behavior of skin       Current Medications:  No outpatient medications have been marked as taking for the 11/19/22 encounter (Appointment) with Thurman Coyer, MD.       Allergies:  No Known Allergies    Past Medical History:  Past Medical History:   Diagnosis Date    Vitamin D deficiency        Past Surgical History:  No past surgical history on file.    Family History:  Family History   Problem Relation Age of Onset    No known problems Mother     No known problems Father        Social History:  Social History     Tobacco Use    Smoking status: Never    Smokeless tobacco: Never   Vaping Use    Vaping status: Never Used   Substance Use Topics    Alcohol use: Never    Drug use: Never          The following sections were reviewed this encounter by the provider:        ROS:   Review of Systems   Constitutional:  Negative for chills, fatigue and fever.   HENT:  Negative for congestion, hearing loss, rhinorrhea, sinus pressure and sore throat.    Eyes:  Negative for pain and visual disturbance.   Respiratory:   Negative for apnea, cough, choking, chest tightness, shortness of breath, wheezing and stridor.    Cardiovascular:  Negative for chest pain, palpitations and leg swelling.   Gastrointestinal:  Negative for abdominal distention, abdominal pain, diarrhea, nausea and vomiting.   Endocrine: Negative for polydipsia, polyphagia and polyuria.   Genitourinary:  Negative for difficulty urinating, dysuria and frequency.   Musculoskeletal:  Negative for arthralgias, back pain, gait problem and myalgias.   Skin:  Negative for pallor and rash.   Neurological:  Negative for weakness, light-headedness, numbness and headaches.   Psychiatric/Behavioral:  Negative for agitation and sleep disturbance.      Objective:     Vitals:  There were no vitals taken for this visit.    Examination:   Physical Exam   General Examination:   Vitals and nursing note reviewed.   Constitutional:       General: He is not in acute distress.     Appearance: Normal appearance.   HENT:  Head: Normocephalic and atraumatic.      Right Ear: Tympanic membrane, ear canal and external ear normal.      Left Ear: Tympanic membrane, ear canal and external ear normal.      Nose: Nose normal.      Mouth/Throat:      Mouth: Mucous membranes are moist.      Pharynx: Oropharynx is clear.   Eyes:      Extraocular Movements: Extraocular movements intact.      Pupils: Pupils are equal, round, and reactive to light.   Cardiovascular:      Rate and Rhythm: Normal rate and regular rhythm.      Pulses: Normal pulses.      Heart sounds: Normal heart sounds.   Pulmonary:      Effort: Pulmonary effort is normal.      Breath sounds: Normal breath sounds.   Abdominal:      General: There is no distension.      Palpations: Abdomen is soft. There is no mass.      Tenderness: There is no abdominal tenderness. There is no right CVA tenderness or left CVA tenderness.   Musculoskeletal:         General: No tenderness. Normal range of motion.      Cervical back: Normal range of motion  and neck supple.      Right lower leg: No edema.      Left lower leg: No edema.   Skin:     General: Skin is warm and dry.      Capillary Refill: Capillary refill takes less than 2 seconds.      Coloration: Skin is not jaundiced or pale.   Neurological:      General: No focal deficit present.      Mental Status: He is alert and oriented to person, place, and time.   Psychiatric:         Mood and Affect: Mood normal.         Behavior: Behavior normal.     Assessment/Plan:       1. Annual physical exam    2. Screening examination for STD (sexually transmitted disease)    3. Need for hepatitis C screening test    4. Immunization due      Health Maintenance:  {PLANREC:35396}{HMSCREENING:35422}    No follow-ups on file.    Thurman Coyer, MD    This note was generated within the EPIC EMR using Dragon medical speech recognition software and may contain inherent errors or omissions not intended by the user. Grammatical and punctuation errors, random word insertions, deletions, pronoun errors and incomplete sentences are occasional consequences of this technology due to software limitations. Not all errors are caught or corrected.  Although every attempt is made to root out erroneus and incomplete transcription, the note may still not fully represent the intent or opinion of the author. If there are questions or concerns about the content of this note or information contained within the body of this dictation they should be addressed directly with the author for clarification.

## 2022-11-19 ENCOUNTER — Encounter (INDEPENDENT_AMBULATORY_CARE_PROVIDER_SITE_OTHER): Payer: Self-pay | Admitting: Student in an Organized Health Care Education/Training Program

## 2022-11-19 ENCOUNTER — Ambulatory Visit (INDEPENDENT_AMBULATORY_CARE_PROVIDER_SITE_OTHER): Payer: BC Managed Care – PPO | Admitting: Student in an Organized Health Care Education/Training Program

## 2022-11-19 VITALS — BP 92/60 | HR 71 | Temp 97.7°F | Resp 18 | Ht 71.5 in | Wt 201.0 lb

## 2022-11-19 DIAGNOSIS — Z23 Encounter for immunization: Secondary | ICD-10-CM

## 2022-11-19 DIAGNOSIS — Z13220 Encounter for screening for lipoid disorders: Secondary | ICD-10-CM

## 2022-11-19 DIAGNOSIS — Z131 Encounter for screening for diabetes mellitus: Secondary | ICD-10-CM

## 2022-11-19 DIAGNOSIS — Z0000 Encounter for general adult medical examination without abnormal findings: Secondary | ICD-10-CM

## 2022-11-19 DIAGNOSIS — Z113 Encounter for screening for infections with a predominantly sexual mode of transmission: Secondary | ICD-10-CM

## 2022-11-19 DIAGNOSIS — D170 Benign lipomatous neoplasm of skin and subcutaneous tissue of head, face and neck: Secondary | ICD-10-CM

## 2022-11-19 DIAGNOSIS — Z1159 Encounter for screening for other viral diseases: Secondary | ICD-10-CM

## 2022-11-19 LAB — LAB USE ONLY - CBC WITH DIFFERENTIAL
Absolute Basophils: 0.06 10*3/uL (ref 0.00–0.08)
Absolute Eosinophils: 0.12 10*3/uL (ref 0.00–0.44)
Absolute Immature Granulocytes: 0.06 10*3/uL (ref 0.00–0.07)
Absolute Lymphocytes: 2.23 10*3/uL (ref 0.42–3.22)
Absolute Monocytes: 0.45 10*3/uL (ref 0.21–0.85)
Absolute Neutrophils: 4.1 10*3/uL (ref 1.10–6.33)
Absolute nRBC: 0 10*3/uL (ref <=0.00)
Basophils %: 0.9 %
Eosinophils %: 1.7 %
Hematocrit: 44.7 % (ref 37.6–49.6)
Hemoglobin: 15.1 g/dL (ref 12.5–17.1)
Immature Granulocytes %: 0.9 %
Lymphocytes %: 31.8 %
MCH: 29.6 pg (ref 25.1–33.5)
MCHC: 33.8 g/dL (ref 31.5–35.8)
MCV: 87.6 fL (ref 78.0–96.0)
MPV: 11 fL (ref 8.9–12.5)
Monocytes %: 6.4 %
Neutrophils %: 58.3 %
Platelet Count: 200 10*3/uL (ref 142–346)
Preliminary Absolute Neutrophil Count: 4.1 10*3/uL (ref 1.10–6.33)
RBC: 5.1 10*6/uL (ref 4.20–5.90)
RDW: 12 % (ref 11–15)
WBC: 7.02 10*3/uL (ref 3.10–9.50)
nRBC %: 0 /100 WBC (ref <=0.0)

## 2022-11-19 LAB — LAB USE ONLY - GOLD SST HOLD TUBE

## 2022-11-19 LAB — COMPREHENSIVE METABOLIC PANEL
ALT: 22 U/L (ref 0–55)
AST (SGOT): 23 U/L (ref 5–41)
Albumin/Globulin Ratio: 1.5 (ref 0.9–2.2)
Albumin: 4.3 g/dL (ref 3.5–5.0)
Alkaline Phosphatase: 52 U/L (ref 37–117)
Anion Gap: 8 (ref 5.0–15.0)
BUN: 15 mg/dL (ref 9–28)
Bilirubin, Total: 0.7 mg/dL (ref 0.2–1.2)
CO2: 26 mEq/L (ref 17–29)
Calcium: 9.6 mg/dL (ref 8.5–10.5)
Chloride: 107 mEq/L (ref 99–111)
Creatinine: 0.9 mg/dL (ref 0.5–1.5)
GFR: >60 mL/min/{1.73_m2} (ref >=60.0)
Globulin: 2.8 g/dL (ref 2.0–3.6)
Glucose: 89 mg/dL (ref 70–100)
Hemolysis Index: 6 Index
Potassium: 4.5 mEq/L (ref 3.5–5.3)
Protein, Total: 7.1 g/dL (ref 6.0–8.3)
Sodium: 141 mEq/L (ref 135–145)

## 2022-11-19 LAB — HEMOGLOBIN A1C
Average Estimated Glucose: 88.2 mg/dL
Hemoglobin A1C: 4.7 % (ref 4.6–5.6)

## 2022-11-19 LAB — LIPID PANEL
Cholesterol / HDL Ratio: 4.3 Index
Cholesterol: 134 mg/dL (ref <=199)
HDL: 31 mg/dL — ABNORMAL LOW (ref >=40)
LDL Calculated: 82 mg/dL (ref 0–129)
Triglycerides: 106 mg/dL (ref 34–149)
VLDL Calculated: 21 mg/dL (ref 10–40)

## 2022-11-19 LAB — LAB USE ONLY - HIV 1/2 AG/AB 4TH GENERATION WITH REFLEX: HIV Ag/Ab 4th Generation: NONREACTIVE

## 2022-11-19 LAB — HEPATITIS C ANTIBODY, TOTAL: Hepatitis C Antibody: NONREACTIVE

## 2022-11-19 LAB — HEPATITIS B (HBV) SURFACE ANTIGEN WITH REFLEX TO CONFIRMATION: Hepatitis B Surface Antigen: NONREACTIVE

## 2022-11-19 LAB — SYPHILIS SCREEN, IGG AND IGM: Syphilis Screen IgG and IgM: NONREACTIVE

## 2022-11-19 LAB — THYROID STIMULATING HORMONE (TSH) WITH REFLEX TO FREE T4: TSH: 2.14 u[IU]/mL (ref 0.35–4.94)

## 2022-11-19 LAB — LAB USE ONLY - LAVENDER - EDTA HOLD TUBE

## 2022-11-19 NOTE — Progress Notes (Signed)
Have you seen any specialists/other providers since your last visit with [place]?    Yes- surgeon for lump on head.     Health Maintenance Due   Topic Date Due    Tetanus Ten-Year  Never done    HPV Series (1 - Male 3-dose series) Never done    HEPATITIS C SCREENING  Never done    COVID-19 Vaccine (1 - 2023-24 season) Never done

## 2022-11-19 NOTE — Progress Notes (Signed)
Lab results all within reassuring ranges. Urine STD pending.  If you have further questions or clarification is needed please contact the office.

## 2022-11-20 ENCOUNTER — Ambulatory Visit (INDEPENDENT_AMBULATORY_CARE_PROVIDER_SITE_OTHER): Payer: BC Managed Care – PPO | Admitting: Surgery

## 2022-11-20 LAB — CHLAMYDIA TRACHOMATIS AND NEISSERIA GONORRHOEAE, PCR
Chlamydia trachomatis DNA: NEGATIVE
Neisseria gonorrhoeae DNA: NEGATIVE

## 2022-11-20 NOTE — Progress Notes (Signed)
Negative STD screen

## 2022-12-25 ENCOUNTER — Ambulatory Visit (INDEPENDENT_AMBULATORY_CARE_PROVIDER_SITE_OTHER): Payer: BC Managed Care – PPO | Admitting: Surgery

## 2023-11-21 NOTE — Progress Notes (Signed)
 [facility] ANNUAL PHYSICAL EXAMINATION               [facility]                             Subjective:      Date: 11/22/2023 8:16 AM   Patient ID: Blake Monroe is a 28 y.o. male.    Chief Complaint:  Chief Complaint   Patient presents with    Annual Exam     Fasting today.     Vitamin D  Deficiency       HPI  The patient is a 28 year old male who presented here today for an annual physical exam.  He had been previously seen for multiple swellings on his scalp, went to see the Gen surgeon Dr Gracia who removed the swellings which ended up being lipomas, they were removed and sent to pathology- Lipoma and the patient states they have recurred.     Visit Type: Health Maintenance Visit  Work Status: working full-time  Reported Health: good health  Reported Diet: compliant with recommended diet  Reported Exercise: occasionally  Dental: dentist visit > 1 year ago  Vision: eye exam > 1 year ago  Hearing: normal hearing  Immunization Status: COVID booster due  Reproductive Health: sexually active  Prior Screening Tests: prostate cancer screening not appropriate at this time, colon cancer screening not approriate at this time, lung cancer screening not appropriate at this time, and dexa scan not appropriate at this time  General Health Risks: no family history of prostate cancer, no family history of colon cancer, and no family history of breast cancer  Safety Elements Used: uses seat belts, smoke detectors in household, and carbon monoxide detectors in household    Problem List:  Problem List[1]    Current Medications:  Medications Taking[2]    Allergies:  Allergies[3]    Past Medical History:  Medical History[4]    Past Surgical History:  Past Surgical History[5]    Family History:  Family History[6]    Social History:  Social History[7]       The following sections were reviewed this encounter by the provider:   Tobacco  Allergies  Meds  Problems  Med Hx  Surg Hx  Fam Hx         ROS:    Review of Systems   Constitutional:  Negative for chills, fatigue and fever.   HENT:  Negative for congestion, hearing loss, rhinorrhea, sinus pressure and sore throat.    Eyes:  Negative for pain and visual disturbance.   Respiratory:  Negative for apnea, cough, choking, chest tightness, shortness of breath, wheezing and stridor.    Cardiovascular:  Negative for chest pain, palpitations and leg swelling.   Gastrointestinal:  Negative for abdominal distention, abdominal pain, diarrhea, nausea and vomiting.   Endocrine: Negative for polydipsia, polyphagia and polyuria.   Genitourinary:  Negative for difficulty urinating, dysuria and frequency.   Musculoskeletal:  Negative for arthralgias, back pain, gait problem and myalgias.   Skin:  Negative for pallor and scalp lipomas.  Neurological:  Negative for weakness, light-headedness, numbness and headaches.   Psychiatric/Behavioral:  Negative for agitation and sleep disturbance.      Objective:     Vitals:  Vitals:    11/22/23 0759   BP: 109/72   BP Site: Left arm   Patient Position: Sitting   Cuff Size: Medium   Pulse: 68  Resp: 18   Temp: 98.4 F (36.9 C)   TempSrc: Oral   SpO2: 97%   Weight: 93.9 kg (207 lb)   Height: 1.816 m (5' 11.5)       Examination:   Physical Exam   General Examination:   Vitals and nursing note reviewed.   Constitutional:       General: He is not in acute distress.     Appearance: Normal appearance.   HENT:      Head: Normocephalic and atraumatic. Non tender scalp swellings, positive slipping sign.      Right Ear: Tympanic membrane, ear canal and external ear normal.      Left Ear: Tympanic membrane, ear canal and external ear normal.      Nose: Nose normal.      Mouth/Throat:      Mouth: Mucous membranes are moist.      Pharynx: Oropharynx is clear.   Eyes:      Extraocular Movements: Extraocular movements intact.      Pupils: Pupils are equal, round, and reactive to light.   Cardiovascular:      Rate and Rhythm: Normal rate and regular  rhythm.      Pulses: Normal pulses.      Heart sounds: Normal heart sounds.   Pulmonary:      Effort: Pulmonary effort is normal.      Breath sounds: Normal breath sounds.   Abdominal:      General: There is no distension.      Palpations: Abdomen is soft. There is no mass.      Tenderness: There is no abdominal tenderness. There is no right CVA tenderness or left CVA tenderness.   Musculoskeletal:         General: No tenderness. Normal range of motion.      Cervical back: Normal range of motion and neck supple.      Right lower leg: No edema.      Left lower leg: No edema.   Skin:     General: Skin is warm and dry.      Capillary Refill: Capillary refill takes less than 2 seconds.      Coloration: Skin is not jaundiced or pale.   Neurological:      General: No focal deficit present.      Mental Status: He is alert and oriented to person, place, and time.   Psychiatric:         Mood and Affect: Mood normal.         Behavior: Behavior normal.     Assessment/Plan:       1. Annual physical exam  The patient is a 28 year old male who presented here today for an annual physical exam.  - CBC with Differential (Order)  - Comprehensive Metabolic Panel    2. Screening examination for STD (sexually transmitted disease)  Patient was counseled about STD screening, he consented to have screening done.   - Hepatitis B (HBV) Surface Antigen with Reflex to Confirmation; Future  - HIV-1/2, Antigen and Antibody with Reflex to Confirmation; Future  - Hepatitis B (HBV) Surface Antigen with Reflex to Confirmation  - HIV-1/2, Antigen and Antibody with Reflex to Confirmation  - RPR with Reflex to Titer; Future  - C. Trach, N. Gonorr, Trich vag by NAA; Future  - RPR with Reflex to Titer  - C. Jamal GEANNIE Doheny, Trich vag by NAA    3. Thyroid  disorder screening  - Thyroid  Stimulating Hormone (TSH) with Reflex  to Free T4    4. Lipid screening  - Lipid Panel    5. Diabetes mellitus screening  - Hemoglobin A1C    6. Vitamin D  deficiency  -  Vitamin D , 25 OH, Total      Health Maintenance:  Recommend optimizing low carbohydrate diet efforts and obtaining at least 150 minutes of aerobic exercise per week. Recommend 20-25 grams of dietary fiber daily. Recommend drinking at least 60-80 ounces of water per day. Recommend optimizing low sodium diet measures ( less than 2 grams of sodium in the diet per day ).Vision screening is due. Dental Screening is due.    No follow-ups on file.    Thirza MALVA Chum, MD    This note was generated within the [employer] EMR using Dragon medical speech recognition software and may contain inherent errors or omissions not intended by the user. Grammatical and punctuation errors, random word insertions, deletions, pronoun errors and incomplete sentences are occasional consequences of this technology due to software limitations. Not all errors are caught or corrected.  Although every attempt is made to root out erroneus and incomplete transcription, the note may still not fully represent the intent or opinion of the author. If there are questions or concerns about the content of this note or information contained within the body of this dictation they should be addressed directly with the author for clarification.          [1]   Patient Active Problem List  Diagnosis    Seborrheic dermatitis    Low vitamin D  level    Neoplasm of uncertain behavior of skin   [2]   Outpatient Medications Marked as Taking for the 11/22/23 encounter (Office Visit) with Liane Tribbey O, MD   Medication Sig Dispense Refill    Multiple Vitamin (MULTIVITAMIN PO) Take by mouth     [3] No Known Allergies  [4]   Past Medical History:  Diagnosis Date    Vitamin D  deficiency    [5] History reviewed. No pertinent surgical history.  [6]   Family History  Problem Relation Name Age of Onset    No known problems Mother      No known problems Father     [7]   Social History  Tobacco Use    Smoking status: Never    Smokeless tobacco: Never   Vaping Use    Vaping  status: Never Used   Substance Use Topics    Alcohol use: Never    Drug use: Never

## 2023-11-22 ENCOUNTER — Encounter (INDEPENDENT_AMBULATORY_CARE_PROVIDER_SITE_OTHER): Payer: Self-pay | Admitting: Student in an Organized Health Care Education/Training Program

## 2023-11-22 ENCOUNTER — Ambulatory Visit (INDEPENDENT_AMBULATORY_CARE_PROVIDER_SITE_OTHER): Admitting: Student in an Organized Health Care Education/Training Program

## 2023-11-22 VITALS — BP 109/72 | HR 68 | Temp 98.4°F | Resp 18 | Ht 71.5 in | Wt 207.0 lb

## 2023-11-22 DIAGNOSIS — Z113 Encounter for screening for infections with a predominantly sexual mode of transmission: Secondary | ICD-10-CM

## 2023-11-22 DIAGNOSIS — Z131 Encounter for screening for diabetes mellitus: Secondary | ICD-10-CM

## 2023-11-22 DIAGNOSIS — E559 Vitamin D deficiency, unspecified: Secondary | ICD-10-CM

## 2023-11-22 DIAGNOSIS — Z1329 Encounter for screening for other suspected endocrine disorder: Secondary | ICD-10-CM

## 2023-11-22 DIAGNOSIS — Z13220 Encounter for screening for lipoid disorders: Secondary | ICD-10-CM

## 2023-11-22 DIAGNOSIS — Z0000 Encounter for general adult medical examination without abnormal findings: Secondary | ICD-10-CM

## 2023-11-22 NOTE — Progress Notes (Signed)
 Have you seen any specialists/other providers since your last visit with [place]?    No    Health Maintenance Due   Topic Date Due    COVID-19 Vaccine (3 - 2024-25 season) 01/13/2023

## 2023-11-23 LAB — COMPREHENSIVE METABOLIC PANEL
ALT: 29 IU/L (ref 0–44)
AST (SGOT): 29 IU/L (ref 0–40)
Albumin: 4.6 g/dL (ref 4.3–5.2)
Alkaline Phosphatase: 70 IU/L (ref 44–121)
BUN / Creatinine Ratio: 17 (ref 9–20)
BUN: 20 mg/dL (ref 6–20)
Bilirubin, Total: 0.5 mg/dL (ref 0.0–1.2)
CO2: 23 mmol/L (ref 20–29)
Calcium: 9.7 mg/dL (ref 8.7–10.2)
Chloride: 102 mmol/L (ref 96–106)
Creatinine: 1.16 mg/dL (ref 0.76–1.27)
Globulin, Total: 2.6 g/dL (ref 1.5–4.5)
Glucose: 94 mg/dL (ref 70–99)
Potassium: 4.7 mmol/L (ref 3.5–5.2)
Protein, Total: 7.2 g/dL (ref 6.0–8.5)
Sodium: 140 mmol/L (ref 134–144)
eGFR: 89 mL/min/1.73 (ref >59)

## 2023-11-23 LAB — CBC AND DIFFERENTIAL
Baso(Absolute): 0.1 x10E3/uL (ref 0.0–0.2)
Basophils Automated: 1 %
Eosinophils Absolute: 0.1 x10E3/uL (ref 0.0–0.4)
Eosinophils Automated: 2 %
Hematocrit: 46 % (ref 37.5–51.0)
Hemoglobin: 15 g/dL (ref 13.0–17.7)
Immature Granulocytes Absolute: 0 x10E3/uL (ref 0.0–0.1)
Immature Granulocytes: 0 %
Lymphocytes Absolute: 2.2 x10E3/uL (ref 0.7–3.1)
Lymphocytes Automated: 36 %
MCH: 29.4 pg (ref 26.6–33.0)
MCHC: 32.6 g/dL (ref 31.5–35.7)
MCV: 90 fL (ref 79–97)
Monocytes Absolute: 0.4 x10E3/uL (ref 0.1–0.9)
Monocytes: 7 %
Neutrophils Absolute Count: 3.4 x10E3/uL (ref 1.4–7.0)
Neutrophils: 53 %
Platelets: 208 x10E3/uL (ref 150–450)
RBC: 5.11 x10E6/uL (ref 4.14–5.80)
RDW: 11.3 % — ABNORMAL LOW (ref 11.6–15.4)
WBC: 6.1 x10E3/uL (ref 3.4–10.8)

## 2023-11-23 LAB — THYROID STIMULATING HORMONE (TSH) WITH REFLEX TO FREE T4: TSH: 2.29 u[IU]/mL (ref 0.450–4.500)

## 2023-11-23 LAB — RPR: RPR: NONREACTIVE

## 2023-11-23 LAB — LIPID PANEL
Cholesterol / HDL Ratio: 3.6 ratio (ref 0.0–5.0)
Cholesterol: 139 mg/dL (ref 100–199)
HDL: 39 mg/dL — ABNORMAL LOW (ref >39)
LDL Chol Calculated (NIH): 80 mg/dL (ref 0–99)
Triglycerides: 110 mg/dL (ref 0–149)
VLDL Calculated: 20 mg/dL (ref 5–40)

## 2023-11-23 LAB — HIV-1/2, ANTIGEN AND ANTIBODY WITH REFLEX TO CONFIRMATION: HIV Screen 4th Generation wRfx: NONREACTIVE

## 2023-11-23 LAB — HEMOGLOBIN A1C: Hemoglobin A1C: 5 % (ref 4.8–5.6)

## 2023-11-23 LAB — HEPATITIS B (HBV) SURFACE ANTIGEN WITH REFLEX TO CONFIRMATION: Hepatitis B Surface Antigen: NEGATIVE

## 2023-11-23 LAB — VITAMIN D, 25 OH, TOTAL: Vitamin D 25-Hydroxy: 26.2 ng/mL — ABNORMAL LOW (ref 30.0–100.0)

## 2023-11-24 ENCOUNTER — Ambulatory Visit (INDEPENDENT_AMBULATORY_CARE_PROVIDER_SITE_OTHER): Payer: Self-pay | Admitting: Student in an Organized Health Care Education/Training Program

## 2023-11-24 ENCOUNTER — Other Ambulatory Visit (INDEPENDENT_AMBULATORY_CARE_PROVIDER_SITE_OTHER): Payer: Self-pay | Admitting: Student in an Organized Health Care Education/Training Program

## 2023-11-24 DIAGNOSIS — E559 Vitamin D deficiency, unspecified: Secondary | ICD-10-CM

## 2023-11-24 MED ORDER — ERGOCALCIFEROL 1.25 MG (50000 UT) PO CAPS
50000.0000 [IU] | ORAL_CAPSULE | ORAL | 0 refills | Status: DC
Start: 2023-11-24 — End: 2024-02-10

## 2023-11-24 NOTE — Progress Notes (Signed)
 CBC (complete blood count) shows that you are not anemic and your white blood cell count and platelet count are within acceptable limits.  Comprehensive Metabolic Panel (CMP) which includes liver function, kidney function, electrolytes, and glucose is within acceptable limits.  A1C (3 month avg sugar screening for diabetes) is within the normal range.  TSH (thyroid  function testing) is within the normal range.  Lipid panel (total cholesterol, LDL (bad) cholesterol, triglycerides, and HDL (good) cholesterol) are within acceptable limits.  Blood STD screening(Syphilis, HIV, Hep B) is negative, Urine STD (Chlamydia trachomatis, Neisseria gonorrhoeae, Mycoplasma genitalium and Trichomonas vaginalis) results are pending.    Your Vit D levels were low. This may be the reason you have been having fatigue recently. I will send in a weekly prescription to the pharmacy and we will recheck your levels in 3 months.

## 2023-11-27 MED ORDER — ERGOCALCIFEROL 1.25 MG (50000 UT) PO CAPS
50000.0000 [IU] | ORAL_CAPSULE | ORAL | 0 refills | Status: AC
Start: 2023-11-27 — End: 2024-02-13

## 2023-11-28 LAB — CT, NG, TRICH VAG BY NAA

## 2023-11-29 ENCOUNTER — Other Ambulatory Visit (INDEPENDENT_AMBULATORY_CARE_PROVIDER_SITE_OTHER): Payer: Self-pay | Admitting: Student in an Organized Health Care Education/Training Program

## 2023-11-29 DIAGNOSIS — Z113 Encounter for screening for infections with a predominantly sexual mode of transmission: Secondary | ICD-10-CM

## 2023-12-01 ENCOUNTER — Encounter (INDEPENDENT_AMBULATORY_CARE_PROVIDER_SITE_OTHER): Payer: Self-pay | Admitting: Student in an Organized Health Care Education/Training Program

## 2023-12-02 ENCOUNTER — Encounter (INDEPENDENT_AMBULATORY_CARE_PROVIDER_SITE_OTHER): Payer: Self-pay | Admitting: Student in an Organized Health Care Education/Training Program

## 2023-12-02 ENCOUNTER — Telehealth (INDEPENDENT_AMBULATORY_CARE_PROVIDER_SITE_OTHER): Admitting: Student in an Organized Health Care Education/Training Program

## 2023-12-02 DIAGNOSIS — K5900 Constipation, unspecified: Secondary | ICD-10-CM

## 2023-12-02 DIAGNOSIS — K649 Unspecified hemorrhoids: Secondary | ICD-10-CM

## 2023-12-02 MED ORDER — DOCUSATE SODIUM 100 MG PO CAPS
100.0000 mg | ORAL_CAPSULE | Freq: Two times a day (BID) | ORAL | 0 refills | Status: AC
Start: 2023-12-02 — End: 2024-01-01

## 2023-12-02 MED ORDER — HYDROCORTISONE ACETATE 25 MG RE SUPP: 25.0000 mg | Freq: Two times a day (BID) | RECTAL | 1 refills | Status: DC

## 2023-12-02 NOTE — Progress Notes (Signed)
 Have you seen any specialists/other providers since your last visit with [place]?    No    Health Maintenance Due   Topic Date Due    COVID-19 Vaccine (3 - 2024-25 season) 01/13/2023

## 2023-12-02 NOTE — Progress Notes (Signed)
 [facility]         Telehealth:  The Patient has given verbal consent for delivery of health care via telehealth.   The patient is located at Home in [place]   The encounter provider is located at their Medical Office in    E[employer] Video Client was utilized for Real Time/Synchronous Telehealth.   The time spent in medical discussion during this visit was 10 minutes.          Subjective     Chief Complaint   Patient presents with    Hemorrhoids   The patient is a 28 year old male who presented here today via televisit with complaints of severe hemorrhoidal pain that started on 11/23/2023 after his annual physical exam.  Patient reports he had been having hard stools likely due to inadequate water intake and then he had severe anal pain, worse at night.  He used an over-the-counter hemorrhoidal cream with mild relief and added fiber to his diet with some improvement of his constipation and noticed overall improvement of symptoms.  However, 2 days prior to this presentation symptoms returned although not as severe as the initial episode.  Reports has had hemorrhoid 5 years ago, has never been to GI for any procedure.  He denies blood in his stool, abdominal pain, nausea, vomiting.    Review of Systems    Constitutional:  Negative for chills, fatigue and fever.   HENT:  Negative for congestion, hearing loss, rhinorrhea, sinus pressure and sore throat.    Eyes:  Negative for pain and visual disturbance.   Respiratory:  Negative for apnea, cough, choking, chest tightness, shortness of breath, wheezing and stridor.    Cardiovascular:  Negative for chest pain, palpitations and leg swelling.   Gastrointestinal:  Negative for abdominal distention, abdominal pain, diarrhea, nausea and vomiting.   Endocrine: Negative for polydipsia, polyphagia and polyuria.   Genitourinary:  Negative for difficulty urinating, dysuria and frequency.   Musculoskeletal:  Negative for arthralgias, back pain, gait problem and  myalgias.   Skin:  Negative for pallor and rash.   Neurological:  Negative for weakness, light-headedness, numbness and headaches.   Psychiatric/Behavioral:  Negative for agitation and sleep disturbance.     Objective     There were no vitals taken for this visit.  Physical Exam  Spoke in complete sentences, not in any obvious distress.   Assessment/Plan   1. Hemorrhoids, unspecified hemorrhoid type (Primary)  - Anusol  or Recti-care cream as needed  - Drink at least 8 glasses of water daily (aim for 64 oz of water daily)  - Refrain from straining or lingering (i.e. reading on toilet)  - Regular physical exercise  - Use wet wipes that are flushable instead of dry tissue paper   - Witch Hazel or Tuck pads for peri-anal relief  - Sitz baths as needed (2 to 3 times/day)  - Consider evaluation for banding with GI.   - hydrocortisone  (Anusol -HC) 25 MG suppository; Place 1 suppository (25 mg) rectally 2 (two) times daily  Dispense: 10 suppository; Refill: 1  - Referral to Gastroenterology (EXTERNAL); Future    2. Constipation, unspecified constipation type  - docusate sodium  (Colace) 100 MG capsule; Take 1 capsule (100 mg) by mouth 2 (two) times daily  Dispense: 60 capsule; Refill: 0       Please pardon any potential grammatical errors or typos as aspects of this note may have been created through speech-to-text software.

## 2023-12-02 NOTE — Progress Notes (Unsigned)
 Made vv appt today.

## 2023-12-04 ENCOUNTER — Other Ambulatory Visit

## 2023-12-04 ENCOUNTER — Other Ambulatory Visit (INDEPENDENT_AMBULATORY_CARE_PROVIDER_SITE_OTHER): Payer: Self-pay | Admitting: Student in an Organized Health Care Education/Training Program

## 2023-12-06 ENCOUNTER — Encounter (INDEPENDENT_AMBULATORY_CARE_PROVIDER_SITE_OTHER): Payer: Self-pay | Admitting: Student in an Organized Health Care Education/Training Program

## 2023-12-09 LAB — CT, NG, TRICH VAG BY NAA
CHLAMYDIA TRACHOMATIS, NAA: NEGATIVE
Gonococcus by Nucleic acid Amp: NEGATIVE
TRICH VAG BY NAA: NEGATIVE

## 2024-03-10 ENCOUNTER — Other Ambulatory Visit (INDEPENDENT_AMBULATORY_CARE_PROVIDER_SITE_OTHER): Payer: Self-pay | Admitting: Student in an Organized Health Care Education/Training Program

## 2024-05-16 ENCOUNTER — Ambulatory Visit (INDEPENDENT_AMBULATORY_CARE_PROVIDER_SITE_OTHER): Admitting: Internal Medicine

## 2024-05-16 VITALS — BP 119/75 | HR 88 | Temp 98.2°F | Resp 20 | Ht 72.0 in | Wt 210.0 lb

## 2024-05-16 DIAGNOSIS — M9511 Cauliflower ear, right ear: Secondary | ICD-10-CM

## 2024-05-16 MED ORDER — CEPHALEXIN 500 MG PO CAPS: 500.0000 mg | ORAL_CAPSULE | Freq: Two times a day (BID) | ORAL | 0 refills | Status: DC

## 2024-05-16 NOTE — Patient Instructions (Signed)
You make take Tylenol or Ibuprofen as needed, please follow instructions on the bottle.   Take all prescriptions as prescribed.  I recommend a probiotic during and for 4-6 weeks after antibiotics.  To the ER for any worsening symptoms or concerns.  Follow up with your PCP in 2-3 days.  Pt in agreement with discharge plan, all questions answered.

## 2024-05-16 NOTE — Progress Notes (Signed)
 [facility]  OFFICE NOTE         Subjective   Historian: Patient      Chief Complaint   Patient presents with    right ear drainage     Right ear has a bump inside of ear that has grown and pt wants it drained   Noticed last week      HPI  Ranny is a 29 y.o. male who presents for R ear edema X 5 days.  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  He has had his L ear drained in the past.  No meds taken.  No F/C.    History:  Medications and Allergies reviewed.   Pertinent Past Medical, Surgical, Family and Social History were reviewed.        Objective     Vitals:    05/16/24 1449   BP: 119/75   BP Site: Right arm   Patient Position: Sitting   Cuff Size: Medium   Pulse: 88   Resp: 20   Temp: 98.2 F (36.8 C)   TempSrc: Tympanic   SpO2: 98%   Weight: 95.3 kg (210 lb)   Height: 1.829 m (6')      Body mass index is 28.48 kg/m.          Physical Exam  HENT:      Head:        Comments: Fluctuance noted without tenderness or discharge.    Vitals and nursing note reviewed.   Constitutional:       General: Not in acute distress.     Appearance: Normal appearance. Not ill-appearing or toxic-appearing.   HENT:      Head: Normocephalic and atraumatic.   Eyes:      Conjunctiva/sclera: Conjunctivae normal.   Neck:      Musculoskeletal: Normal range of motion.   Respiratory:      Normal effort. Able to speak in full sentences.  Neurological:      Mental Status: Alert and oriented.  Psychiatric:         Mood and Affect: Mood normal.         Behavior: Behavior normal.     Urgent Care Course   There were no labs reviewed with this patient during the visit.    There were no x-rays reviewed with this patient during the visit.      Procedures   Incision and Drainage    Date/Time: 05/16/2024 3:13 PM    Performed by: Beulah Dione LABOR, MD  Authorized by: Beulah Dione LABOR, MD    Consent:     Consent obtained:  Verbal    Consent given by:  Patient    Risks discussed:  Infection, pain, poor cosmetic result, vascular  damage, poor wound healing, nerve damage and need for additional repair    Alternatives discussed:  Referral  Type:  Hematoma  Body area:  Head  Location details:  Right external ear  Complexity:  Simple  Drainage:  Bloody  Drainage amount:  Moderate  Wound treatment:  Wound left open  Patient tolerance:  Patient tolerated the procedure well with no immediate complications       Assessment / Plan     Differential Diagnoses including but not limited to: Cellulitis; Abscess; Cauliflower ear.    Keflex ; Probiotic; To ER for any acutely worsening sx or concerns.    Randeep was seen today for right ear drainage.    Diagnoses and all orders for  this visit:    Cauliflower ear, right    Other orders  -     cephALEXin  (KEFLEX ) 500 MG capsule; Take 1 capsule (500 mg) by mouth 2 (two) times daily for 10 days  -     Incision and Drainage         The indications for early follow-up with PCP and return to UC were discussed. Patient/family received education on the working diagnosis, diagnostic uncertainties, and proposed treatment plan. Indications for emergency evaluation in the ED were reviewed. Written and verbal discharge instructions were provided and discussed and all questions from the patient/family were addressed, with no apparent barriers.

## 2024-05-19 ENCOUNTER — Ambulatory Visit (INDEPENDENT_AMBULATORY_CARE_PROVIDER_SITE_OTHER): Admitting: Nurse Practitioner

## 2024-05-19 ENCOUNTER — Encounter (INDEPENDENT_AMBULATORY_CARE_PROVIDER_SITE_OTHER): Payer: Self-pay | Admitting: Nurse Practitioner

## 2024-05-19 VITALS — BP 108/67 | HR 79 | Temp 97.4°F | Resp 15 | Ht 72.0 in | Wt 210.0 lb

## 2024-05-19 DIAGNOSIS — M9511 Cauliflower ear, right ear: Secondary | ICD-10-CM

## 2024-05-19 NOTE — Progress Notes (Signed)
 Have you seen any specialists since your last visit with [place]?  No      The patient was informed that the following HM items are still outstanding:   nothing at this time, HM is up-to-date.

## 2024-05-19 NOTE — Patient Instructions (Signed)
 Take medication as prescribed.   Continue to take your routine medication     Current treatment plan discussed with patient. Patient understands and agrees with plan. Common side effects of medications were discussed with patient and all questions were addressed. Discussed symptoms the patient should watch for that would prompt the need for re--evaluation.     Discussed and reviewed warning signs for worsening condition.   Patient expressed understanding of instructions.     If you experience any dizziness, chest pain, trouble breathing, change in vision, confusion, weakness, numbness, tingling, severe headache, nausea, vomiting, severe abdominal pain, or change in speech --- go to the Emergency department immediately.

## 2024-05-19 NOTE — Progress Notes (Signed)
 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     Auricular swelling  - Recurrent swelling of the right ear following MMA training, most recently after a ji-jitsu session.  --Patient was seen at the urgent care 3 days ago 05/16/2024 after presenting with right ear edema after MMA training.   --Patient reports that his initial swelling was significant and nearly occluding the canal, he underwent draining at the urgent care.   --He was also discharged on antibiotics Keflex .   --Patient reports that he has not taken any antibiotics.  He is adamant on taking antibiotics.   - Swelling has recurred but is milder, nonpainful, and without hearing loss.  --He reports that he has had his left ear drained in the past after sustaining left ear edema during an MMA event.       He denies any redness or any signs of infection  He denies any hearing loss  He denies any dizziness or tinnitus    Review of Systems   Constitutional:  Negative for activity change, appetite change, chills, diaphoresis, fatigue, fever and unexpected weight change.   HENT:  Positive for ear pain (right ear swelling). Negative for congestion, ear discharge, rhinorrhea, sinus pressure, sinus pain, sneezing and sore throat.    Respiratory:  Negative for apnea, cough, choking, chest tightness, shortness of breath, wheezing and stridor.        Objective   BP 108/67   Pulse 79   Temp 97.4 F (36.3 C)   Resp 15   Ht 1.829 m (6')   Wt 95.3 kg (210 lb)   SpO2 100%   BMI 28.48 kg/m   Physical Exam  HENT:      Right Ear: Hearing and tympanic membrane normal. Swelling present. No drainage or tenderness. No middle ear effusion. There is no impacted cerumen. Tympanic  membrane is not scarred, perforated, erythematous or bulging.      Left Ear: Hearing and tympanic membrane normal. No drainage, swelling or tenderness.  No middle ear effusion. There is no impacted cerumen. Tympanic membrane is not scarred, perforated, erythematous or bulging.      Ears:        Comments: Swelling to right ear esp around the [facility], and antihelix      Physical Exam  GENERAL: Alert, cooperative, well developed, no acute distress.  HEENT: Normocephalic, normal oropharynx, moist mucous membranes, ear canals intact and not swollen bilaterally.  CHEST: Clear to auscultation bilaterally, no wheezes, rhonchi, or crackles.  CARDIOVASCULAR: Normal heart rate and rhythm, S1 and S2 normal without murmurs.  ABDOMEN: Soft, non-tender, non-distended, without organomegaly, normal bowel sounds.  EXTREMITIES: No cyanosis or edema.  NEUROLOGICAL: Cranial nerves grossly intact, moves all extremities without gross motor or sensory deficit.     Results  Auricular hematoma drainage, right ear  Needle aspiration performed on right auricle. Hematoma drained. Approximately 1.5 to 2 mL of blood evacuated. Swelling decreased with pressure. Pressure dressing applied to maintain contour and prevent reaccumulation.      Assessment/Plan   Incision and Drainage     Date/Time: 05/19/2024 11:30AM     Performed by: Fredi,  Johnston NP  Authorized by: Fredi Johnston NP  Consent:     Consent obtained:  Verbal    Consent given by:  Patient    Risks discussed:  Infection, pain, poor cosmetic result, vascular damage, poor wound healing, nerve damage and need for additional repair    Alternatives discussed:  Referral  Type:  Hematoma  Body area:  Head  Location details:  Right external ear  Complexity:  Simple  Drainage:  Bloody  Drainage amount:  Moderate  Wound treatment:  Wound left open  Patient tolerance:  Patient tolerated the procedure well with no immediate complications  Assessment & Plan  1. Cauliflower ear, right  --Re accumulated  auricular hematoma, soft, no pain or infection, no hearing loss.  - Drained hematoma with syringe.  - Instructed on applying pressure to prevent reaccumulation.  - Advised pressure application for 10-15 minutes intermittently .  - Provided extra materials for home pressure application.  --- Patient advised to take Keflex  that was already prescribed by the urgent care physician.   -- Discussed signs and symptoms of infection and what to look out for.     Verbal consent obtained to record this visit.

## 2024-05-20 ENCOUNTER — Encounter (INDEPENDENT_AMBULATORY_CARE_PROVIDER_SITE_OTHER)

## 2024-05-25 ENCOUNTER — Ambulatory Visit (INDEPENDENT_AMBULATORY_CARE_PROVIDER_SITE_OTHER)

## 2024-05-25 ENCOUNTER — Encounter (INDEPENDENT_AMBULATORY_CARE_PROVIDER_SITE_OTHER): Payer: Self-pay

## 2024-05-25 VITALS — BP 99/62 | HR 80 | Temp 97.9°F | Ht 72.0 in | Wt 213.0 lb

## 2024-05-25 DIAGNOSIS — Z23 Encounter for immunization: Secondary | ICD-10-CM

## 2024-05-25 DIAGNOSIS — S00431A Contusion of right ear, initial encounter: Secondary | ICD-10-CM

## 2024-05-25 DIAGNOSIS — M9511 Cauliflower ear, right ear: Secondary | ICD-10-CM

## 2024-05-25 DIAGNOSIS — Z0000 Encounter for general adult medical examination without abnormal findings: Secondary | ICD-10-CM

## 2024-05-25 NOTE — Progress Notes (Addendum)
 [facility] - [facility]    Health Maintenance Exam Note      Date: 05/25/2024 7:51 AM   Patient ID: Blake Monroe is a 29 y.o. male.  DOB: [birth year]    Subjective:   Blake Monroe is a 29 y.o. male presenting for a Health Maintenance Exam and to review chronic disease management.    Pt is married, has 2 boys; 1 is newborn engineer, maintenance (it)) and other is 3 years (Blake Monroe).     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 Was on 1/6 where 1.5 cc bloody fluid was drained. Fluid has re-accumulated. Pt denies any fever, chills, hearing loss. He has drained it at home few days ago.     Acute concerns to be addressed today: None   Work Status: working full-time. Construction inspection   Reported Health: excellent health  Diet: compliant with well-balanced diet  Exercise: regularly  Sleep:8 hrs per night  Dental: dentist visit > 1 year ago  Vision: no vision correction needed  Dermatology:no current dermatologic screening  Reproductive Health: sexually active  Safety Elements Used: uses seat belts, sunscreen use, and no guns at home    - Immunizations: due for influenza:       Problem List:  Problem List[1]    Current Medications:  Medications Taking[2]    Allergies:  Allergies[3]    Past Medical History:  Medical History[4]    Past Surgical History:  Past Surgical History[5]    Family History:  Family History[6]    Social History:  Social History[7]       The following sections were reviewed this encounter by the provider:   Tobacco  Allergies  Meds  Problems  Med Hx  Surg Hx  Fam Hx         ROS:  Denies fever, chills, weight change, chest pain, dyspnea, palpitations, abdominal pain, nausea, vomiting, bowel or urinary changes, dizziness, headaches, mood changes. All other systems reviewed and negative except as noted.    Vitals:  BP 99/62 (BP Site: Right arm, Patient Position: Sitting, Cuff Size: Medium)   Pulse 80   Temp 97.9 F (36.6 C)   Ht 1.829 m (6')   Wt  96.6 kg (213 lb)   SpO2 100%   BMI 28.89 kg/m     Body mass index is 28.89 kg/m.    Physical Exam:  General: Well-appearing, in no acute distress.  HEENT: Normocephalic, atraumatic; EOMI; oropharynx clear.  CV: Regular rate and rhythm, no murmurs, rubs, or gallops.  Resp: Clear to auscultation bilaterally, no wheezes, rales, or rhonchi.  Abd: Soft, non-tender, non-distended, no organomegaly.  Ext: No edema, full range of motion.  Neuro: Alert and oriented, no focal deficits.  Skin: Warm, dry, no rashes or lesions.    PHQ-9: 0       Assessment/Plan:   [name] is a 29 y.o. male presenting for annual wellness physical. In summary, we completed his health maintenance exam and discussed other concerns as stated below.    1. Encounter for medical examination to establish care (Primary)  Annual preventive exam completed today. Counseled on a balanced, whole-food diet emphasizing vegetables, fruits, lean protein, fiber, and healthy fats; advised to limit ultra-processed foods, excess sugar, and saturated fats. Encouraged at least 150 minutes/week of moderate-intensity aerobic activity plus 2 days/week of strength training, as tolerated. Advised 7-9 hours of sleep nightly and reviewed basic sleep hygiene practices.  - Cancer screening reviewed  - Vaccines updated  -  Lifestyle counseling provided  - Pt wants labs drawn in July (will place order when patient messages on mychart)     2. Need for influenza vaccination  - Flu vaccine, TRIVALENT, 6 months and older (FLUARIX /FLULAVAL /FLUZONE ), single-dose PF, 0.5 mL    3. Hematoma of right auricular region  Pt states he didn't take the antibiotics (keflex ). He drained the ear at home. Today 1.5 cc was bloody fluid was drained. Swelling decreased. Reviewed signs and symptoms of infection with patient.       Follow-up:     Routine follow-up in 1 year for Annual Physical or as needed for acute symptoms  and Follow-up sooner if symptoms worsen     Avelino Blanch, MD         [1]    Patient Active Problem List  Diagnosis    Seborrheic dermatitis    Low vitamin D  level    Neoplasm of uncertain behavior of skin   [2]   No outpatient medications have been marked as taking for the 05/25/24 encounter (Office Visit) with Blanch Avelino, MD.   [3] No Known Allergies  [4]   Past Medical History:  Diagnosis Date    Vitamin D  deficiency    [5] History reviewed. No pertinent surgical history.  [6]   Family History  Problem Relation Name Age of Onset    No known problems Mother      No known problems Father     [7]   Social History  Tobacco Use    Smoking status: Never    Smokeless tobacco: Never   Vaping Use    Vaping status: Never Used   Substance Use Topics    Alcohol use: Never    Drug use: Never
